# Patient Record
Sex: Male | Born: 1955 | Race: Black or African American | Hispanic: No | Smoking: Never smoker
Health system: Southern US, Community
[De-identification: ages and names within clinical notes are randomized; demographics above are authoritative.]

## PROBLEM LIST (undated history)

## (undated) DIAGNOSIS — F32A Depression, unspecified: Secondary | ICD-10-CM

## (undated) DIAGNOSIS — E785 Hyperlipidemia, unspecified: Secondary | ICD-10-CM

## (undated) DIAGNOSIS — R059 Cough, unspecified: Secondary | ICD-10-CM

## (undated) DIAGNOSIS — I252 Old myocardial infarction: Secondary | ICD-10-CM

## (undated) DIAGNOSIS — R9431 Abnormal electrocardiogram [ECG] [EKG]: Secondary | ICD-10-CM

## (undated) DIAGNOSIS — M549 Dorsalgia, unspecified: Secondary | ICD-10-CM

## (undated) DIAGNOSIS — F1010 Alcohol abuse, uncomplicated: Secondary | ICD-10-CM

## (undated) DIAGNOSIS — I4891 Unspecified atrial fibrillation: Secondary | ICD-10-CM

## (undated) DIAGNOSIS — I10 Essential (primary) hypertension: Secondary | ICD-10-CM

## (undated) DIAGNOSIS — K4090 Unilateral inguinal hernia, without obstruction or gangrene, not specified as recurrent: Secondary | ICD-10-CM

## (undated) DIAGNOSIS — M2140 Flat foot [pes planus] (acquired), unspecified foot: Secondary | ICD-10-CM

## (undated) DIAGNOSIS — Z8619 Personal history of other infectious and parasitic diseases: Secondary | ICD-10-CM

## (undated) DIAGNOSIS — A599 Trichomoniasis, unspecified: Secondary | ICD-10-CM

## (undated) DIAGNOSIS — F1421 Cocaine dependence, in remission: Secondary | ICD-10-CM

## (undated) DIAGNOSIS — Z634 Disappearance and death of family member: Secondary | ICD-10-CM

## (undated) DIAGNOSIS — Z7901 Long term (current) use of anticoagulants: Secondary | ICD-10-CM

## (undated) DIAGNOSIS — G4700 Insomnia, unspecified: Secondary | ICD-10-CM

## (undated) DIAGNOSIS — D649 Anemia, unspecified: Secondary | ICD-10-CM

## (undated) DIAGNOSIS — J449 Chronic obstructive pulmonary disease, unspecified: Secondary | ICD-10-CM

## (undated) DIAGNOSIS — I351 Nonrheumatic aortic (valve) insufficiency: Secondary | ICD-10-CM

## (undated) DIAGNOSIS — F4310 Post-traumatic stress disorder, unspecified: Secondary | ICD-10-CM

## (undated) DIAGNOSIS — I2510 Atherosclerotic heart disease of native coronary artery without angina pectoris: Secondary | ICD-10-CM

## (undated) DIAGNOSIS — D869 Sarcoidosis, unspecified: Secondary | ICD-10-CM

## (undated) DIAGNOSIS — L7633 Postprocedural seroma of skin and subcutaneous tissue following a dermatologic procedure: Secondary | ICD-10-CM

## (undated) DIAGNOSIS — I7100 Dissection of unspecified site of aorta: Secondary | ICD-10-CM

## (undated) DIAGNOSIS — F329 Major depressive disorder, single episode, unspecified: Secondary | ICD-10-CM

## (undated) HISTORY — PX: AORTIC VALVE REPLACEMENT: SHX41

## (undated) HISTORY — PX: HERNIA REPAIR: SHX51

---

## 2001-09-29 DIAGNOSIS — Z952 Presence of prosthetic heart valve: Secondary | ICD-10-CM

## 2001-09-29 HISTORY — DX: Presence of prosthetic heart valve: Z95.2

## 2019-07-25 ENCOUNTER — Ambulatory Visit (INDEPENDENT_AMBULATORY_CARE_PROVIDER_SITE_OTHER): Payer: Federal, State, Local not specified - PPO

## 2019-07-25 ENCOUNTER — Ambulatory Visit
Admission: EM | Admit: 2019-07-25 | Discharge: 2019-07-25 | Disposition: A | Discharge status: Home / Self Care | Payer: Federal, State, Local not specified - PPO | Attending: Internal Medicine | Admitting: Internal Medicine

## 2019-07-25 ENCOUNTER — Other Ambulatory Visit: Payer: Self-pay

## 2019-07-25 DIAGNOSIS — R05 Cough: Secondary | ICD-10-CM

## 2019-07-25 DIAGNOSIS — J441 Chronic obstructive pulmonary disease with (acute) exacerbation: Secondary | ICD-10-CM

## 2019-07-25 MED ORDER — DOXYCYCLINE HYCLATE 100 MG PO CAPS: 100.0000 mg | ORAL_CAPSULE | Freq: Two times a day (BID) | ORAL | 0 refills | Status: AC

## 2019-07-25 MED ORDER — PREDNISONE 20 MG PO TABS: 20.0000 mg | ORAL_TABLET | Freq: Every day | ORAL | 0 refills | Status: AC

## 2019-07-25 NOTE — ED Triage Notes (Signed)
Green productive cough starting Friday, night sweats. Subjective fever. Fatigue. Pt alert and oriented X4, cooperative, RR even and unlabored, color WNL. Pt in NAD.

## 2019-07-25 NOTE — ED Provider Notes (Addendum)
[facility]    CSN: [id number] Arrival date & time: 07/25/19  1444      History   Chief Complaint Chief Complaint  Patient presents with  . Cough    HPI Juan Herrera is a 63 y.o. male comes to the urgent care with complaints of cough productive of greenish sputum, subjective fever fatigue 1 week duration.  Symptoms started insidiously and has continued progressively worse. He denies any URI symptoms preceding this event.No sore throat.  No body aches.  No shortness of breath, wheezing or chest pain.  No dizziness, near syncope or syncopal episode.  Patient denies any sick contacts.   No diarrhea.  HPI  History reviewed. No pertinent past medical history.  There are no active problems to display for this patient.  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  Past medical history:  Aortic valve replacement  Home Medications    Prior to Admission medications   Medication Sig Start Date End Date Taking? Authorizing Provider  warfarin (COUMADIN) 7.5 MG tablet Take 7.5 mg by mouth daily. 7.5mg  X 5 days a week, 5mg  X 2 days a week   Yes [provider]  doxycycline (VIBRAMYCIN) 100 MG capsule Take 1 capsule (100 mg total) by mouth 2 (two) times daily for 5 days. 07/25/19 07/30/19  08/01/19, MD  predniSONE (DELTASONE) 20 MG tablet Take 1 tablet (20 mg total) by mouth daily for 5 days. 07/25/19 07/30/19  08/01/19, MD    Family History No family history on file.  Social History Social History   Tobacco Use  . Smoking status: Never Smoker  Substance Use Topics  . Alcohol use: Yes    Comment: occassionaly  . Drug use: Not on file     Allergies   Patient has no allergy information on record.   Review of Systems Review of Systems  Constitutional: Positive for activity change, chills, fatigue and fever.  HENT: Negative.   Eyes: Negative.   Respiratory: Positive for cough. Negative  for chest tightness, shortness of breath, wheezing and stridor.   Cardiovascular: Negative.   Gastrointestinal: Negative.   Genitourinary: Negative.   Musculoskeletal: Negative.   Skin: Negative.   Neurological: Negative.   Psychiatric/Behavioral: Negative for confusion and decreased concentration.     Physical Exam Triage Vital Signs ED Triage Vitals [07/25/19 1500]  Enc Vitals Group     BP 122/86     Pulse Rate 92     Resp 18     Temp 98.3 F (36.8 C)     Temp Source Oral     SpO2 100 %     Weight 155 lb (70.3 kg)     Height 5\' 7"  (1.702 m)     Head Circumference      Peak Flow      Pain Score 0     Pain Loc      Pain Edu?      Excl. in [place]?    No data found.  Updated Vital Signs BP 122/86 (BP Location: Left Arm)   Pulse 92   Temp 98.3 F (36.8 C) (Oral)   Resp 18   Ht 5\' 7"  (1.702 m)   Wt 70.3 kg   SpO2 100%   BMI 24.28 kg/m   Visual Acuity Right Eye Distance:   Left Eye Distance:   Bilateral Distance:    Right Eye Near:   Left Eye Near:    Bilateral  Near:     Physical Exam Vitals signs and nursing note reviewed.  Constitutional:      General: He is not in acute distress.    Appearance: Normal appearance. He is not ill-appearing or toxic-appearing.  HENT:     Right Ear: Tympanic membrane normal.     Left Ear: Tympanic membrane normal.     Mouth/Throat:     Mouth: Mucous membranes are moist.     Pharynx: No oropharyngeal exudate or posterior oropharyngeal erythema.  Eyes:     Conjunctiva/sclera: Conjunctivae normal.  Neck:     Musculoskeletal: Normal range of motion. No neck rigidity or muscular tenderness.  Cardiovascular:     Rate and Rhythm: Normal rate and regular rhythm.     Pulses: Normal pulses.     Heart sounds: Normal heart sounds.  Pulmonary:     Effort: Pulmonary effort is normal. No respiratory distress.     Breath sounds: No stridor. No wheezing, rhonchi or rales.  Abdominal:     General: Abdomen is flat. Bowel sounds are  normal.  Musculoskeletal: Normal range of motion.        General: No swelling, tenderness or signs of injury.  Skin:    General: Skin is warm.     Capillary Refill: Capillary refill takes less than 2 seconds.     Findings: No erythema or rash.  Neurological:     General: No focal deficit present.     Mental Status: He is alert and oriented to person, place, and time. Mental status is at baseline.     Cranial Nerves: No cranial nerve deficit.     Sensory: No sensory deficit.      UC Treatments / Results  Labs (all labs ordered are listed, but only abnormal results are displayed) Labs Reviewed  NOVEL CORONAVIRUS, NAA (HOSP ORDER, SEND-OUT TO REF LAB; TAT 18-24 HRS)    EKG   Radiology Dg Chest 2 View  Result Date: 07/25/2019 CLINICAL DATA:  Productive cough, fever, night sweats EXAM: CHEST - 2 VIEW COMPARISON:  None. FINDINGS: Heart is normal size. Prior CABG and valve replacement. There is dilatation of the aortic arch and proximal to mid descending thoracic aorta. Tortuosity of the thoracic aorta. There is hyperinflation of the lungs compatible with COPD. Linear scarring in the right mid lung. Right apical scarring/pleural thickening. No acute confluent opacities or effusions. No acute bony abnormality. IMPRESSION: Hyperinflation/COPD. Chronic appearing scarring in the right upper lobe. 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 Recommend further evaluation with chest CT. Electronically Signed   By: Charlett NoseKevin  Dover M.D.   On: 07/25/2019 16:16    Procedures Procedures (including critical care time)  Medications Ordered in UC Medications - No data to display  Initial Impression / Assessment and Plan / UC Course  I have reviewed the triage vital signs and the nursing notes.  Pertinent labs & imaging results that were available during my care of the patient were reviewed by me and considered in my medical decision making (see chart for details).      1.  COPD with acute exacerbation: Chest x-ray is remarkable for COPD. COVID-19 has been sent Patient is advised to self isolate until COVID-19 test results are available patient No wheezing currently has no indication for albuterol inhalation If patient's symptoms gets worse he needs to visit emergency department for further evaluation. Final Clinical Impressions(s) / UC Diagnoses and short course of   Final diagnoses:  COPD with acute  exacerbation [facility])   Discharge Instructions   None    ED Prescriptions    Medication Sig Dispense Auth. Provider   doxycycline (VIBRAMYCIN) 100 MG capsule Take 1 capsule (100 mg total) by mouth 2 (two) times daily for 5 days. 10 capsule Sharell Hilmer, Myrene Galas, MD   predniSONE (DELTASONE) 20 MG tablet Take 1 tablet (20 mg total) by mouth daily for 5 days. 5 tablet Gaelen Brager, Myrene Galas, MD     PDMP not reviewed this encounter.   Chase Picket, MD 07/27/19 1517    Chase Picket, MD 07/27/19 1520

## 2019-07-27 LAB — NOVEL CORONAVIRUS, NAA (HOSP ORDER, SEND-OUT TO REF LAB; TAT 18-24 HRS): SARS-CoV-2, NAA: NOT DETECTED

## 2019-08-06 ENCOUNTER — Encounter: Payer: Self-pay | Admitting: Emergency Medicine

## 2019-08-06 ENCOUNTER — Ambulatory Visit
Admission: EM | Admit: 2019-08-06 | Discharge: 2019-08-06 | Disposition: A | Discharge status: Home / Self Care | Payer: Federal, State, Local not specified - PPO | Attending: Family Medicine | Admitting: Family Medicine

## 2019-08-06 ENCOUNTER — Other Ambulatory Visit: Payer: Self-pay

## 2019-08-06 DIAGNOSIS — Z029 Encounter for administrative examinations, unspecified: Secondary | ICD-10-CM

## 2019-08-06 NOTE — ED Triage Notes (Signed)
Patient was seen 07/25/19 and requesting a note to return to work tomorrow.

## 2019-09-05 NOTE — ED Provider Notes (Signed)
[facility]    CSN: [id number] Arrival date & time: 08/06/19  1140      History   Chief Complaint Chief Complaint  Patient presents with  . Follow-up  . work note    HPI Juan Herrera is a 63 y.o. male.   63 yo male here for note to return to work. Was seen here on 07/25/19 and treated. States he feels well.      History reviewed. No pertinent past medical history.  There are no active problems to display for this patient.   Past Surgical History:  Procedure Laterality Date  . VALVE REPLACEMENT         Home Medications    Prior to Admission medications   Medication Sig Start Date End Date Taking? Authorizing Provider  warfarin (COUMADIN) 7.5 MG tablet Take 7.5 mg by mouth daily. 7.5mg  X 5 days a week, 5mg  X 2 days a week   Yes [provider]    Family History Family History  Problem Relation Age of Onset  . Healthy Mother        passed of old age  . Healthy Father     Social History Social History   Tobacco Use  . Smoking status: Never Smoker  . Smokeless tobacco: Never Used  Substance Use Topics  . Alcohol use: Yes    Comment: occassionaly  . Drug use: Never     Allergies   Patient has no known allergies.   Review of Systems Review of Systems   Physical Exam Triage Vital Signs ED Triage Vitals  Enc Vitals Group     BP 08/06/19 1156 138/85     Pulse Rate 08/06/19 1156 72     Resp 08/06/19 1156 16     Temp 08/06/19 1156 98.2 F (36.8 C)     Temp Source 08/06/19 1156 Oral     SpO2 08/06/19 1156 97 %     Weight 08/06/19 1155 155 lb (70.3 kg)     Height 08/06/19 1155 5\' 7"  (1.702 m)     Head Circumference --      Peak Flow --      Pain Score 08/06/19 1155 0     Pain Loc --      Pain Edu? --      Excl. in [place]? --    No data found.  Updated Vital Signs BP 138/85 (BP Location: Left Arm)   Pulse 72   Temp 98.2 F (36.8 C) (Oral)   Resp 16   Ht 5\' 7"  (1.702 m)   Wt 70.3 kg   SpO2 97%   BMI 24.28  kg/m   Visual Acuity Right Eye Distance:   Left Eye Distance:   Bilateral Distance:    Right Eye Near:   Left Eye Near:    Bilateral Near:     Physical Exam Vitals signs and nursing note reviewed.  Constitutional:      General: He is not in acute distress.    Appearance: He is not toxic-appearing.  Neurological:     Mental Status: He is alert.      UC Treatments / Results  Labs (all labs ordered are listed, but only abnormal results are displayed) Labs Reviewed - No data to display  EKG   Radiology No results found.  Procedures Procedures (including critical care time)  Medications Ordered in UC Medications - No data to display  Initial Impression / Assessment and Plan / UC Course  I have  reviewed the triage vital signs and the nursing notes.  Pertinent labs & imaging results that were available during my care of the patient were reviewed by me and considered in my medical decision making (see chart for details).      Final Clinical Impressions(s) / UC Diagnoses   Final diagnoses:  Administrative encounter    ED Prescriptions    None     1. Work note given  PDMP not reviewed this encounter.   Payton Mccallum, MD 09/05/19 1949

## 2019-12-20 ENCOUNTER — Ambulatory Visit
Admission: EM | Admit: 2019-12-20 | Discharge: 2019-12-20 | Disposition: A | Discharge status: Home / Self Care | Payer: Federal, State, Local not specified - PPO | Attending: Family Medicine | Admitting: Family Medicine

## 2019-12-20 ENCOUNTER — Encounter: Payer: Self-pay | Admitting: Emergency Medicine

## 2019-12-20 ENCOUNTER — Other Ambulatory Visit: Payer: Self-pay

## 2019-12-20 DIAGNOSIS — R319 Hematuria, unspecified: Secondary | ICD-10-CM

## 2019-12-20 DIAGNOSIS — N390 Urinary tract infection, site not specified: Secondary | ICD-10-CM | POA: Diagnosis not present

## 2019-12-20 DIAGNOSIS — Z113 Encounter for screening for infections with a predominantly sexual mode of transmission: Secondary | ICD-10-CM | POA: Diagnosis present

## 2019-12-20 HISTORY — DX: Chronic obstructive pulmonary disease, unspecified: J44.9

## 2019-12-20 HISTORY — DX: Personal history of other infectious and parasitic diseases: Z86.19

## 2019-12-20 LAB — URINALYSIS, COMPLETE (UACMP) WITH MICROSCOPIC
Bilirubin Urine: NEGATIVE
Glucose, UA: NEGATIVE mg/dL
Ketones, ur: NEGATIVE mg/dL
Nitrite: NEGATIVE
Protein, ur: 30 mg/dL — AB
Specific Gravity, Urine: >1.03 — ABNORMAL HIGH (ref 1.005–1.030)
Squamous Epithelial / HPF: NONE SEEN (ref 0–5)
WBC, UA: >50 WBC/hpf (ref 0–5)
pH: 6 (ref 5.0–8.0)

## 2019-12-20 MED ORDER — NITROFURANTOIN MONOHYD MACRO 100 MG PO CAPS: 100.0000 mg | ORAL_CAPSULE | Freq: Two times a day (BID) | ORAL | 0 refills | Status: DC

## 2019-12-20 NOTE — ED Triage Notes (Signed)
Patient c/o soreness in his penis area x 1 week. Denies discharge. He does reports some dysuria. He reports that he is not concerned for STDS but would like to be checked.

## 2019-12-20 NOTE — Discharge Instructions (Signed)
It was very nice seeing you today in clinic. Thank you for entrusting me with your care.   Increase fluid intake as much as possible. Water is the best to flush urinary tract. STD testing  is pending at this time. We will call you if anything is positive and you need further treatment. In the mean time, start the antibiotics for the UTI; finish all of the prescription even is feeling better.   Make arrangements to follow up with your regular doctor in 1 week for re-evaluation if not improving. If your symptoms/condition worsens, please seek follow up care either here or in the ER. Please remember, our [facility] providers are "right here with you" when you need [place].   Again, it was my pleasure to take care of you today. Thank you for choosing our clinic. I hope that you start to feel better quickly.   Quentin Mulling, MSN, APRN, FNP-C, CEN Advanced Practice Provider [facility]

## 2019-12-21 LAB — CHLAMYDIA/NGC RT PCR (ARMC ONLY)
Chlamydia Tr: NOT DETECTED
N gonorrhoeae: NOT DETECTED

## 2019-12-21 LAB — HEPATITIS PANEL, ACUTE
HCV Ab: NONREACTIVE
Hep A IgM: NONREACTIVE
Hep B C IgM: NONREACTIVE
Hepatitis B Surface Ag: NONREACTIVE

## 2019-12-21 LAB — HIV ANTIBODY (ROUTINE TESTING W REFLEX): HIV Screen 4th Generation wRfx: NONREACTIVE

## 2019-12-21 LAB — RPR: RPR Ser Ql: NONREACTIVE

## 2019-12-22 ENCOUNTER — Encounter: Payer: Self-pay | Admitting: Urgent Care

## 2019-12-22 LAB — URINE CULTURE

## 2019-12-22 NOTE — ED Provider Notes (Signed)
[place]   Name: Juan Herrera DOB: 10/10/1955 MRN: [id number] CSN: [id number] PCP: System, Pcp Not In  Arrival date and time:  12/20/19 1612  Chief Complaint:  Dysuria and Penis Pain  NOTE: Prior to seeing the patient today, I have reviewed the triage nursing documentation and vital signs. Clinical staff has updated patient's PMH/PSHx, current medication list, and drug allergies/intolerances to ensure comprehensive history available to assist in medical decision making.   History:   HPI: Juan Herrera is a 64 y.o. male who presents today with complaints of urinary symptoms that began approximately 1 week ago. He complains of dysuria, frequency, and urgency. He has not appreciated any gross hematuria, nor has he noticed his urine being malodorous. Patient denies any associated nausea, vomiting, fever, or chills. He has not experienced any pain in his lower back or flank area, however notes pain and pressure in his suprapubic area. Patient advises that he does not have a past medical history that is significant for recurrent urinary tract infections or urolithiasis. He has a PMH (+) for chlamydia. Patient endoreses that he engages in unprotected sexual activity. He is having pain in his penis from time to time. He denies pain testicles or scrotum; no swelling. No penile bleeding or discharge. While patient verbalizes that he is not concerned about STIs, he is requesting blood and urine testing today.   Past Medical History:  Diagnosis Date  . COPD (chronic obstructive pulmonary disease) (HCC)   . History of chlamydia     Past Surgical History:  Procedure Laterality Date  . AORTIC VALVE REPLACEMENT      Family History  Problem Relation Age of Onset  . Healthy Mother        passed of old age  . Healthy Father     Social History   Tobacco Use  . Smoking status: Never Smoker  . Smokeless tobacco: Never Used  Substance Use Topics  . Alcohol use: Yes    Comment:  occassionaly  . Drug use: Never    There are no problems to display for this patient.   Home Medications:    Current Meds  Medication Sig  . warfarin (COUMADIN) 7.5 MG tablet Take 7.5 mg by mouth daily. 7.5mg  X 5 days a week, 5mg  X 2 days a week    Allergies:   Patient has no known allergies.  Review of Systems (ROS):  Review of systems NEGATIVE unless otherwise noted in narrative H&P section.   Vital Signs: Today's Vitals   12/20/19 1636 12/20/19 1637 12/20/19 1727  BP: (!) 145/93    Pulse: 79    Resp: 18    Temp: 98.4 F (36.9 C)    TempSrc: Oral    SpO2: 98%    Weight:  150 lb (68 kg)   Height:  5\' 7"  (1.702 m)   PainSc: 5   5     Physical Exam: Physical Exam  Constitutional: He is oriented to person, place, and time and well-developed, well-nourished, and in no distress.  HENT:  Head: Normocephalic and atraumatic.  Eyes: Pupils are equal, round, and reactive to light.  Cardiovascular: Normal rate, regular rhythm, normal heart sounds and intact distal pulses.  Pulmonary/Chest: Effort normal and breath sounds normal.  Abdominal: Soft. Normal appearance and bowel sounds are normal. He exhibits no distension. There is abdominal tenderness (pressure) in the suprapubic area. There is no CVA tenderness.  Genitourinary:    Genitourinary Comments: Exam deferred. No penile bleeding or  discharge. No testicular symptoms.    Neurological: He is alert and oriented to person, place, and time. Gait normal.  Skin: Skin is warm and dry. No rash noted. He is not diaphoretic.  Psychiatric: Memory, affect and judgment normal. His mood appears anxious.  Nursing note and vitals reviewed.   Urgent Care Treatments / Results:   LABS: PLEASE NOTE: all labs that were ordered this encounter are listed, however only abnormal results are displayed. Labs Reviewed  URINALYSIS, COMPLETE (UACMP) WITH MICROSCOPIC - Abnormal; Notable for the following components:      Result Value    APPearance HAZY (*)    Specific Gravity, Urine >1.030 (*)    Hgb urine dipstick TRACE (*)    Protein, ur 30 (*)    Leukocytes,Ua MODERATE (*)    Bacteria, UA FEW (*)    All other components within normal limits  CHLAMYDIA/NGC RT PCR ([facility] ONLY)  URINE CULTURE  RPR  HIV ANTIBODY (ROUTINE TESTING W REFLEX)  HEPATITIS PANEL, ACUTE    EKG: -None  RADIOLOGY: No results found.  PROCEDURES: Procedures  MEDICATIONS RECEIVED THIS VISIT: Medications - No data to display  PERTINENT CLINICAL COURSE NOTES/UPDATES:   Initial Impression / Assessment and Plan / Urgent Care Course:  Pertinent labs & imaging results that were available during my care of the patient were personally reviewed by me and considered in my medical decision making (see lab/imaging section of note for values and interpretations).  Juan Herrera is a 64 y.o. male who presents to [facility] today with complaints of Dysuria and Penis Pain  Patient is well appearing overall in clinic today. He does not appear to be in any acute distress. Presenting symptoms (see HPI) and exam as documented above. Symptoms present x 1 week. No fevers, nausea, or vomiting. Patient requesting STI testing. Will proceed as follows:   Marland Kitchen UA was (+) for infection; reflex culture sent.   Will treat with a 5 day course of nitrofurantoin. Patient encouraged to complete the entire course of antibiotics even if he begins to feel better. He was advised that if culture demonstrates resistance to the prescribed antibiotic, he will be contacted and advised of the need to change the antibiotic being used to treat his infection.    Patient encouraged to increase his fluid intake as much as possible. Discussed that water is always best to flush the urinary tract. He was advised to avoid caffeine containing fluids until his infections clears, as caffeine can cause him to experience painful bladder spasms.    May use Tylenol and/or  Ibuprofen as needed for pain/fever.    Patient requesting STI testing in response to his current symptoms. Patient was advised that he would be contacted by phone should any of the tests result (+) and he require further treatment. Encouraged to abstain from sexual activity until after symptoms have resolved and his STI testing has resulted.   Random urine sample collected and sent for [facility] testing.    Blood samples obtained for acute hepatitis panel, HIV, and RPR.   Discussed follow up with primary care physician in 1 week for re-evaluation. I have reviewed the follow up and strict return precautions for any new or worsening symptoms. Patient is aware of symptoms that would be deemed urgent/emergent, and would thus require further evaluation either here or in the emergency department. At the time of discharge, he verbalized understanding and consent with the discharge plan as it was reviewed with him. All questions were  fielded by provider and/or clinic staff prior to patient discharge.    Final Clinical Impressions / Urgent Care Diagnoses:   Final diagnoses:  Urinary tract infection with hematuria, site unspecified  Screening examination for STD (sexually transmitted disease)    New Prescriptions:  [place] Controlled Substance Registry consulted? Not Applicable  Meds ordered this encounter  Medications  . nitrofurantoin, macrocrystal-monohydrate, (MACROBID) 100 MG capsule    Sig: Take 1 capsule (100 mg total) by mouth 2 (two) times daily.    Dispense:  10 capsule    Refill:  0    Recommended Follow up Care:  Patient encouraged to follow up with the following provider within the specified time frame, or sooner as dictated by the severity of his symptoms. As always, he was instructed that for any urgent/emergent care needs, he should seek care either here or in the emergency department for more immediate evaluation.  Follow-up Information    PCP In 1 week.   Why: General reassessment of  symptoms if not improving        NOTE: This note was prepared using Lobbyist along with smaller Company secretary. Despite my best ability to proofread, there is the potential that transcriptional errors may still occur from this process, and are completely unintentional.    Karen Kitchens, NP 12/22/19 [phone]

## 2019-12-28 ENCOUNTER — Other Ambulatory Visit: Payer: Self-pay

## 2019-12-28 ENCOUNTER — Ambulatory Visit
Admission: EM | Admit: 2019-12-28 | Discharge: 2019-12-28 | Disposition: A | Discharge status: Home / Self Care | Payer: Federal, State, Local not specified - PPO | Attending: Emergency Medicine | Admitting: Emergency Medicine

## 2019-12-28 ENCOUNTER — Ambulatory Visit (INDEPENDENT_AMBULATORY_CARE_PROVIDER_SITE_OTHER)
Admit: 2019-12-28 | Discharge: 2019-12-28 | Disposition: A | Discharge status: Home / Self Care | Payer: Federal, State, Local not specified - PPO | Attending: Emergency Medicine | Admitting: Emergency Medicine

## 2019-12-28 DIAGNOSIS — N50811 Right testicular pain: Secondary | ICD-10-CM | POA: Diagnosis not present

## 2019-12-28 DIAGNOSIS — N451 Epididymitis: Secondary | ICD-10-CM

## 2019-12-28 LAB — URINALYSIS, COMPLETE (UACMP) WITH MICROSCOPIC
Bilirubin Urine: NEGATIVE
Glucose, UA: NEGATIVE mg/dL
Hgb urine dipstick: NEGATIVE
Ketones, ur: NEGATIVE mg/dL
Nitrite: NEGATIVE
Protein, ur: 30 mg/dL — AB
Specific Gravity, Urine: >1.03 — ABNORMAL HIGH (ref 1.005–1.030)
Squamous Epithelial / HPF: NONE SEEN (ref 0–5)
pH: 5.5 (ref 5.0–8.0)

## 2019-12-28 MED ORDER — LEVOFLOXACIN 500 MG PO TABS: 500.0000 mg | ORAL_TABLET | Freq: Every day | ORAL | 0 refills | Status: DC

## 2019-12-28 NOTE — Discharge Instructions (Addendum)
The final reading on your ultrasound is pending, I will call you if we need to change your management in any way.  Follow-up with your primary care physician in several days as the antibiotic but I am sending you home on can increase your INR.  You need to have your warfarin dose adjusted accordingly while you are taking the Levaquin.  Follow-up with a urologist at the [facility] if you are not not getting better after finishing the Levaquin, go to the ER for the signs and symptoms we discussed.  You may apply ice and scrotal elevation as needed for pain relief.  I have sent off a urine for culture.  We will call you only if we need to change things.

## 2019-12-28 NOTE — ED Provider Notes (Signed)
HPI  SUBJECTIVE:  Juan Herrera is a 64 y.o. male who presents with 1 1/2 weeks of intermittent right testicular pain described as soreness. He states it lasts hours. He reports swelling behind his testicle and is not sure if the testicle itself is swollen. He states it is very sensitive to touch. No nausea vomiting fevers scrotal swelling. No abdominal back pelvic perineal pain. No dysuria urgency frequency cloudy odorous urine hematuria. No testicular trauma or change in physical activity. He had symptoms like this before 25 years ago but was not sure what the diagnosis was. States that he was treated with medicine and scrotal elevation. Patient was seen here on 3/21 for urinary symptoms.  He had no testicular pain per note, however patient states that he was having the testicular pain when he was evaluated. Gonorrhea and Chlamydia were negative, his urine was suggestive of a UTI and was started on Macrobid.  His urine culture grew out mixed species. Patient states that he finished the Macrobid and his testicular pain improved. States that his symptoms returned several days after. He has also tried testicular elevation with improvement in his symptoms. No aggravating factors. He is in a long-term monogamous relationship with a male who is asymptomatic. He has a past medical history of chlamydia, aortic valve replacement on warfarin, COPD. No history of diabetes hypertension UTI pyelonephritis prostatitis epididymitis orchitis torsion gonorrhea HIV HSV trichomonas. [facility]rology: None. States that he will be able to see one at the [place] if necessary.  Past Medical History:  Diagnosis Date  . COPD (chronic obstructive pulmonary disease) ([facility])   . History of chlamydia     Past Surgical History:  Procedure Laterality Date  . AORTIC VALVE REPLACEMENT      Family History  Problem Relation Age of Onset  . Healthy Mother        passed of old age  . Healthy Father     Social History    Tobacco Use  . Smoking status: Never Smoker  . Smokeless tobacco: Never Used  Substance Use Topics  . Alcohol use: Yes    Comment: occassionaly  . Drug use: Never    No current facility-administered medications for this encounter.  Current Outpatient Medications:  .  warfarin (COUMADIN) 7.5 MG tablet, Take 7.5 mg by mouth daily. 7.5mg  X 5 days a week, 5mg  X 2 days a week, Disp: , Rfl:  .  levofloxacin (LEVAQUIN) 500 MG tablet, Take 1 tablet (500 mg total) by mouth daily., Disp: 10 tablet, Rfl: 0  No Known Allergies   ROS  As noted in HPI.   Physical Exam  BP 119/84 (BP Location: Left Arm)   Pulse 80   Temp 98.1 F (36.7 C) (Oral)   Resp 18   Ht 5\' 7"  (1.702 m)   Wt 68 kg   SpO2 99%   BMI 23.49 kg/m   Constitutional: Well developed, well nourished, no acute distress Eyes:  EOMI, conjunctiva normal bilaterally HENT: Normocephalic, atraumatic,mucus membranes moist Respiratory: Normal inspiratory effort Cardiovascular: Normal rate GI: nondistended soft.  No suprapubic, right or left lower quadrant tenderness. GU: Normal circumcised male.  No penile rash, discharge.  Positive tenderness , swelling of the right epididymis and questionable swelling of the right testicle.  Positive phren's sign.  Scrotum nonerythematous.  No cremasteric reflex on either side.  Large left nontender inguinal hernia.  Patient declined chaperone Rectal: Normal nontender firm not boggy, normal-sized prostate Lymph: No inguinal lymphadenopathy skin: No rash,  skin intact Musculoskeletal: no deformities Neurologic: Alert & oriented x 3, no focal neuro deficits Psychiatric: Speech and behavior appropriate   ED Course   Medications - No data to display  Orders Placed This Encounter  Procedures  . Urine culture    Standing Status:   Standing    Number of Occurrences:   1    Order Specific Question:   List patient's active antibiotics    Answer:   levaquin  . US SCROTUM    1.5 week  intermittent  R testicle pain, r/o torsion, epidydimitis, orchitis    Standing Status:   Standing    Number of Occurrences:   1    Order Specific Question:   Symptom/Reason for Exam    Answer:   Pain in right testicle [[id number]]    Order Specific Question:   Release to patient    Answer:   Immediate  . Urinalysis, Complete w Microscopic    Standing Status:   Standing    Number of Occurrences:   1    Results for orders placed or performed during the hospital encounter of 12/28/19 (from the past 24 hour(s))  Urinalysis, Complete w Microscopic     Status: Abnormal   Collection Time: 12/28/19  3:59 PM  Result Value Ref Range   Color, Urine YELLOW YELLOW   APPearance CLEAR CLEAR   Specific Gravity, Urine >1.030 (H) 1.005 - 1.030   pH 5.5 5.0 - 8.0   Glucose, UA NEGATIVE NEGATIVE mg/dL   Hgb urine dipstick NEGATIVE NEGATIVE   Bilirubin Urine NEGATIVE NEGATIVE   Ketones, ur NEGATIVE NEGATIVE mg/dL   Protein, ur 30 (A) NEGATIVE mg/dL   Nitrite NEGATIVE NEGATIVE   Leukocytes,Ua SMALL (A) NEGATIVE   Squamous Epithelial / LPF NONE SEEN 0 - 5   WBC, UA 6-10 0 - 5 WBC/hpf   RBC / HPF 0-5 0 - 5 RBC/hpf   Bacteria, UA RARE (A) NONE SEEN   US SCROTUM  Result Date: 12/28/2019 CLINICAL DATA:  Right testicular pain for the past 2 weeks. EXAM: SCROTAL ULTRASOUND DOPPLER ULTRASOUND OF THE TESTICLES TECHNIQUE: Complete ultrasound examination of the testicles, epididymis, and other scrotal structures was performed. Color and spectral Doppler ultrasound were also utilized to evaluate blood flow to the testicles. COMPARISON:  None. FINDINGS: Right testicle Measurements: 3.5 x 2.4 x 3.0 cm. No mass or microlithiasis visualized. Left testicle Measurements: 4.1 x 2.0 x 2.4 cm. No mass or microlithiasis visualized. Right epididymis:  Enlarged and hyperemic. Left epididymis: Normal in size and appearance. 5 mm epididymal cyst. Hydrocele:  Small right hydrocele. Varicocele:  None visualized. Pulsed Doppler  interrogation of both testes demonstrates normal low resistance arterial and venous waveforms bilaterally. IMPRESSION: 1. Right epididymitis with small reactive right hydrocele. Electronically Signed   By: Obie Dredge M.D.   On: 12/28/2019 17:19    ED Clinical Impression  1. Epididymitis, right   2. Pain in right testicle      ED Assessment/Plan  Previous records reviewed as noted in HPI.  Suspect epididymitis versus epididymo-orchitis.  However ultrasound is available today at the urgent care, so we will get ultrasound to rule out torsion.  No evidence of Fournier's gangrene.  Checking urine.  Since his gonorrhea and Chlamydia were negative, will not repeat this.  Recent UTI is a risk factor for epididymitis, and while his recent urine culture was inconclusive for UTI he did respond to the Macrobid.   will check another UA.  UA with small leukocytes  and rare bacteria. Trace protein. It is also concentrated. Will send this off for culture to confirm absence of UTI and if present, susceptibility to Levaquin.  Reviewed imaging independently and with the ultrasound tech. Positive epididymitis on the right side with a reactive hydrocele.  Normal blood flow.  See radiology report for details  Patient with epididymitis. Plan to send home with Levaquin 500 mg daily for 10 days.  There is no associated prostatitis so 10 days should be sufficient.  Also, local application of ice, scrotal elevation.  Follow-up with his PMD in several days for INR check.  Bactrim also elevates INR.  Levaquin was the most reasonable choice.  Follow-up with urology at the [facility] if symptoms do not improve after finishing the Levaquin.  To the ER if he gets worse.  Discussed labs, imaging, MDM, treatment plan, and plan for follow-up with patient. Discussed sn/sx that should prompt return to the ED. patient agrees with plan.   Meds ordered this encounter  Medications  . levofloxacin (LEVAQUIN) 500 MG tablet    Sig: Take 1  tablet (500 mg total) by mouth daily.    Dispense:  10 tablet    Refill:  0    *This clinic note was created using Scientist, clinical (histocompatibility and immunogenetics). Therefore, there may be occasional mistakes despite careful proofreading.   ?    Domenick Gong, MD 12/28/19 1755

## 2019-12-28 NOTE — ED Triage Notes (Signed)
Pt presents with c/o continued scrotal pain. Pt was seen 12/20/19 and treated for possible UTI. Pt has completed Macrobid and still has symptoms. Pt denies any urinary symptoms.

## 2019-12-29 LAB — URINE CULTURE: Culture: NO GROWTH

## 2020-04-10 ENCOUNTER — Ambulatory Visit
Admission: EM | Admit: 2020-04-10 | Discharge: 2020-04-10 | Disposition: A | Discharge status: Home / Self Care | Payer: Federal, State, Local not specified - PPO | Attending: Family Medicine | Admitting: Family Medicine

## 2020-04-10 DIAGNOSIS — J441 Chronic obstructive pulmonary disease with (acute) exacerbation: Secondary | ICD-10-CM | POA: Diagnosis present

## 2020-04-10 DIAGNOSIS — Z20822 Contact with and (suspected) exposure to covid-19: Secondary | ICD-10-CM | POA: Diagnosis not present

## 2020-04-10 MED ORDER — AZITHROMYCIN 250 MG PO TABS: ORAL_TABLET | ORAL | 0 refills | Status: DC

## 2020-04-10 MED ORDER — PREDNISONE 50 MG PO TABS: ORAL_TABLET | ORAL | 0 refills | Status: DC

## 2020-04-10 MED ORDER — BENZONATATE 200 MG PO CAPS: 200.0000 mg | ORAL_CAPSULE | Freq: Three times a day (TID) | ORAL | 0 refills | Status: DC | PRN

## 2020-04-10 NOTE — ED Triage Notes (Signed)
Pt presents to UC for productive cough x3 days. Pt states phlegm has transitioned from clear to green. Pt denies, sob, chills, body aches, head aches, runny nose, sore throat, loss of taste or smell. Pt endorsing subjective fevers. Pt requesting covid testing at this time.

## 2020-04-10 NOTE — ED Provider Notes (Signed)
[facility]    CSN: [id number] Arrival date & time: 04/10/20  1322  History   Chief Complaint Chief Complaint  Patient presents with  . Cough   HPI  64 year old male presents with productive cough.  Patient has known COPD.  Patient reports worsening cough for the past 3 days.  Productive.  Yellow to green sputum.  He reports subjective fever.  No other respiratory symptoms.  No reported sick contacts.  Endorses compliance with his home medications.  No other associated symptoms.  No other complaints.  Past Medical History:  Diagnosis Date  . COPD (chronic obstructive pulmonary disease) ([facility])   . History of chlamydia    Past Surgical History:  Procedure Laterality Date  . AORTIC VALVE REPLACEMENT     Home Medications    Prior to Admission medications   Medication Sig Start Date End Date Taking? Authorizing Provider  buPROPion (WELLBUTRIN) 100 MG tablet Take by mouth.   Yes [provider]  warfarin (COUMADIN) 7.5 MG tablet Take 7.5 mg by mouth daily. 7.5mg  X 5 days a week, 5mg  X 2 days a week   Yes [provider]  azithromycin (ZITHROMAX) 250 MG tablet 2 tablets on day 1, then 1 tablet daily on days 2-5. 04/10/20   04/12/20, DO  benzonatate (TESSALON) 200 MG capsule Take 1 capsule (200 mg total) by mouth 3 (three) times daily as needed for cough. 04/10/20   04/12/20, DO  predniSONE (DELTASONE) 50 MG tablet 1 tablet daily x 5 days 04/10/20   04/12/20, DO    Family History Family History  Problem Relation Age of Onset  . Healthy Mother        passed of old age  . Healthy Father     Social History Social History   Tobacco Use  . Smoking status: Never Smoker  . Smokeless tobacco: Never Used  Vaping Use  . Vaping Use: Never used  Substance Use Topics  . Alcohol use: Yes    Comment: occassionaly  . Drug use: Never     Allergies   Patient has no known allergies.   Review of Systems Review of Systems  Constitutional:  Positive for fever.  Respiratory: Positive for cough.    Physical Exam Triage Vital Signs ED Triage Vitals  Enc Vitals Group     BP 04/10/20 1338 125/84     Pulse Rate 04/10/20 1338 74     Resp 04/10/20 1338 16     Temp 04/10/20 1338 98.3 F (36.8 C)     Temp Source 04/10/20 1338 Oral     SpO2 04/10/20 1338 98 %     Weight --      Height --      Head Circumference --      Peak Flow --      Pain Score 04/10/20 1340 0     Pain Loc --      Pain Edu? --      Excl. in GC? --     Updated Vital Signs BP 125/84 (BP Location: Right Arm)   Pulse 74   Temp 98.3 F (36.8 C) (Oral)   Resp 16   SpO2 98%   Visual Acuity Right Eye Distance:   Left Eye Distance:   Bilateral Distance:    Right Eye Near:   Left Eye Near:    Bilateral Near:     Physical Exam Vitals and nursing note reviewed.  Constitutional:  General: He is not in acute distress.    Appearance: Normal appearance. He is not ill-appearing.  HENT:     Head: Normocephalic and atraumatic.  Eyes:     General:        Right eye: No discharge.        Left eye: No discharge.     Conjunctiva/sclera: Conjunctivae normal.  Cardiovascular:     Rate and Rhythm: Normal rate and regular rhythm.     Heart sounds: No murmur heard.   Pulmonary:     Effort: Pulmonary effort is normal.     Breath sounds: No wheezing or rales.  Neurological:     Mental Status: He is alert.  Psychiatric:        Mood and Affect: Mood normal.        Behavior: Behavior normal.    UC Treatments / Results  Labs (all labs ordered are listed, but only abnormal results are displayed) Labs Reviewed  SARS CORONAVIRUS 2 (TAT 6-24 HRS)    EKG   Radiology No results found.  Procedures Procedures (including critical care time)  Medications Ordered in UC Medications - No data to display  Initial Impression / Assessment and Plan / UC Course  I have reviewed the triage vital signs and the nursing notes.  Pertinent labs & imaging  results that were available during my care of the patient were reviewed by me and considered in my medical decision making (see chart for details).    64 year old male presents with a COPD exacerbation.  Treating with prednisone, azithromycin, Tessalon Perles.  Advised that he needs close follow-up regarding his INR given antibiotic use.  Final Clinical Impressions(s) / UC Diagnoses   Final diagnoses:  COPD exacerbation [facility])   Discharge Instructions   None    ED Prescriptions    Medication Sig Dispense Auth. Provider   predniSONE (DELTASONE) 50 MG tablet 1 tablet daily x 5 days 5 tablet Colby Catanese G, DO   azithromycin (ZITHROMAX) 250 MG tablet 2 tablets on day 1, then 1 tablet daily on days 2-5. 6 tablet Magaly Pollina G, DO   benzonatate (TESSALON) 200 MG capsule Take 1 capsule (200 mg total) by mouth 3 (three) times daily as needed for cough. 30 capsule Tommie Sams, DO     PDMP not reviewed this encounter.   Tommie Sams, DO 04/10/20 1455

## 2020-04-11 LAB — SARS CORONAVIRUS 2 (TAT 6-24 HRS): SARS Coronavirus 2: NEGATIVE

## 2020-06-19 ENCOUNTER — Ambulatory Visit
Admission: EM | Admit: 2020-06-19 | Discharge: 2020-06-19 | Disposition: A | Discharge status: Home / Self Care | Payer: Federal, State, Local not specified - PPO | Attending: Physician Assistant | Admitting: Physician Assistant

## 2020-06-19 ENCOUNTER — Other Ambulatory Visit: Payer: Self-pay

## 2020-06-19 ENCOUNTER — Encounter: Payer: Self-pay | Admitting: Emergency Medicine

## 2020-06-19 DIAGNOSIS — Z20822 Contact with and (suspected) exposure to covid-19: Secondary | ICD-10-CM | POA: Diagnosis not present

## 2020-06-19 DIAGNOSIS — R05 Cough: Secondary | ICD-10-CM | POA: Insufficient documentation

## 2020-06-19 DIAGNOSIS — Z79899 Other long term (current) drug therapy: Secondary | ICD-10-CM | POA: Insufficient documentation

## 2020-06-19 DIAGNOSIS — J441 Chronic obstructive pulmonary disease with (acute) exacerbation: Secondary | ICD-10-CM | POA: Insufficient documentation

## 2020-06-19 DIAGNOSIS — Z952 Presence of prosthetic heart valve: Secondary | ICD-10-CM | POA: Insufficient documentation

## 2020-06-19 DIAGNOSIS — Z7901 Long term (current) use of anticoagulants: Secondary | ICD-10-CM | POA: Insufficient documentation

## 2020-06-19 DIAGNOSIS — R059 Cough, unspecified: Secondary | ICD-10-CM

## 2020-06-19 MED ORDER — AZITHROMYCIN 250 MG PO TABS: 250.0000 mg | ORAL_TABLET | Freq: Every day | ORAL | 0 refills | Status: DC

## 2020-06-19 MED ORDER — PREDNISONE 20 MG PO TABS: 20.0000 mg | ORAL_TABLET | Freq: Every day | ORAL | 0 refills | Status: AC

## 2020-06-19 NOTE — ED Triage Notes (Signed)
Patient c/o cough that started on Friday. Denies any other symptoms.

## 2020-06-19 NOTE — Discharge Instructions (Signed)

## 2020-06-19 NOTE — ED Provider Notes (Signed)
[facility]    CSN: [id number] Arrival date & time: 06/19/20  1601      History   Chief Complaint Chief Complaint  Patient presents with  . Cough    HPI Juan Herrera is a 64 y.o. male.   Patient presents with complaint of cough x4 days.  He says the cough is productive of yellowish-green mucus.  He denies any associated fevers or breathing difficulty.  Denies chest pain.  He denies any known Covid exposure.  He says he had Covid about 6 months ago.  Patient has a history of COPD and asthma as well.  He says he uses 2 different inhalers.  He denies any sore throat, nasal congestion, sinus pain or pressure, abdominal pain, nausea, vomiting, diarrhea, changes in smell or taste.  He has been taking Mucinex without much relief of the cough.  He has no other concerns today.  HPI  Past Medical History:  Diagnosis Date  . COPD (chronic obstructive pulmonary disease) (HCC)   . History of chlamydia     There are no problems to display for this patient.   Past Surgical History:  Procedure Laterality Date  . AORTIC VALVE REPLACEMENT         Home Medications    Prior to Admission medications   Medication Sig Start Date End Date Taking? Authorizing Provider  buPROPion (WELLBUTRIN) 100 MG tablet Take by mouth.   Yes [provider]  warfarin (COUMADIN) 7.5 MG tablet Take 7.5 mg by mouth daily. 7.5mg  X 5 days a week, 5mg  X 2 days a week   Yes [provider]  azithromycin (ZITHROMAX) 250 MG tablet Take 1 tablet (250 mg total) by mouth daily. Take first 2 tablets together, then 1 every day until finished. 06/19/20   Shirlee LatchEaves, Tucker Minter B, PA-C  predniSONE (DELTASONE) 20 MG tablet Take 1 tablet (20 mg total) by mouth daily for 5 days. 06/19/20 06/24/20  Shirlee LatchEaves, Dawsen Krieger B, PA-C    Family History Family History  Problem Relation Age of Onset  . Healthy Mother        passed of old age  . Healthy Father     Social History Social History   Tobacco Use  .  Smoking status: Never Smoker  . Smokeless tobacco: Never Used  Vaping Use  . Vaping Use: Never used  Substance Use Topics  . Alcohol use: Yes    Comment: occassionaly  . Drug use: Never     Allergies   Patient has no known allergies.   Review of Systems Review of Systems  Constitutional: Negative for fatigue and fever.  HENT: Negative for congestion, rhinorrhea, sinus pressure, sinus pain and sore throat.   Respiratory: Positive for cough. Negative for shortness of breath and wheezing.   Gastrointestinal: Negative for abdominal pain, diarrhea, nausea and vomiting.  Musculoskeletal: Negative for myalgias.  Neurological: Negative for weakness, light-headedness and headaches.  Hematological: Negative for adenopathy.     Physical Exam Triage Vital Signs ED Triage Vitals  Enc Vitals Group     BP 06/19/20 1614 129/89     Pulse Rate 06/19/20 1614 85     Resp 06/19/20 1614 18     Temp 06/19/20 1614 98.4 F (36.9 C)     Temp Source 06/19/20 1614 Oral     SpO2 06/19/20 1614 98 %     Weight 06/19/20 1611 150 lb (68 kg)     Height 06/19/20 1611 5\' 7"  (1.702 m)  Head Circumference --      Peak Flow --      Pain Score 06/19/20 1611 0     Pain Loc --      Pain Edu? --      Excl. in GC? --    No data found.  Updated Vital Signs BP 129/89 (BP Location: Right Arm)   Pulse 85   Temp 98.4 F (36.9 C) (Oral)   Resp 18   Ht 5\' 7"  (1.702 m)   Wt 150 lb (68 kg)   SpO2 98%   BMI 23.49 kg/m       Physical Exam Vitals and nursing note reviewed.  Constitutional:      General: He is not in acute distress.    Appearance: Normal appearance. He is well-developed. He is not toxic-appearing or diaphoretic.  HENT:     Head: Normocephalic and atraumatic.     Nose: Nose normal.     Mouth/Throat:     Mouth: Mucous membranes are moist.     Pharynx: Oropharynx is clear. Uvula midline.     Tonsils: No tonsillar abscesses.  Eyes:     General: No scleral icterus.       Right eye:  No discharge.        Left eye: No discharge.     Conjunctiva/sclera: Conjunctivae normal.     Pupils: Pupils are equal, round, and reactive to light.  Neck:     Thyroid: No thyromegaly.     Trachea: No tracheal deviation.  Cardiovascular:     Rate and Rhythm: Normal rate and regular rhythm.     Heart sounds: Normal heart sounds.  Pulmonary:     Effort: Pulmonary effort is normal. No respiratory distress.     Breath sounds: Normal breath sounds. No wheezing or rales.  Musculoskeletal:     Cervical back: Normal range of motion and neck supple.  Lymphadenopathy:     Cervical: No cervical adenopathy.  Skin:    General: Skin is warm and dry.     Findings: No rash.  Neurological:     General: No focal deficit present.     Mental Status: He is alert. Mental status is at baseline.     Motor: No weakness.     Gait: Gait normal.  Psychiatric:        Mood and Affect: Mood normal.        Behavior: Behavior normal.        Thought Content: Thought content normal.      UC Treatments / Results  Labs (all labs ordered are listed, but only abnormal results are displayed) Labs Reviewed  SARS CORONAVIRUS 2 (TAT 6-24 HRS)    EKG   Radiology No results found.  Procedures Procedures (including critical care time)  Medications Ordered in UC Medications - No data to display  Initial Impression / Assessment and Plan / UC Course  I have reviewed the triage vital signs and the nursing notes.  Pertinent labs & imaging results that were available during my care of the patient were reviewed by me and considered in my medical decision making (see chart for details).   64 year old male with multiple comorbidities presenting for worsening cough.  He does have COPD and says he gets really sick sometimes.  Covid testing obtained.  CDC guidelines, isolation protocol and ED precautions discussed if positive.  He would be interested in the MAB therapy if positive.  Advised patient that we will call  him and set that  up if positive.  Since he does have COPD and has a new worsening cough, treating for COPD exacerbation with prednisone and azithromycin.  He says he has had this before and it did not affect his INRs.  He keeps regular check on those.  Advised patient to increase fluids and take Mucinex.  He should follow-up for any fever, shortness of breath or worsening symptoms.  Final Clinical Impressions(s) / UC Diagnoses   Final diagnoses:  COPD exacerbation (HCC)  Cough     Discharge Instructions     You have received COVID testing today either for positive exposure, concerning symptoms that could be related to COVID infection, screening purposes, or re-testing after confirmed positive.  Your test obtained today checks for active viral infection in the last 1-2 weeks. If your test is negative now, you can still test positive later. So, if you do develop symptoms you should either get re-tested and/or isolate x 10 days. Please follow CDC guidelines.  While Rapid antigen tests come back in 15-20 minutes, send out PCR/molecular test results typically come back within 24 hours. In the mean time, if you are symptomatic, assume this could be a positive test and treat/monitor yourself as if you do have COVID.   We will call with test results. Please download the MyChart app and set up a profile to access test results.   If symptomatic, go home and rest. Push fluids. Take Tylenol as needed for discomfort. Gargle warm salt water. Throat lozenges. Take Mucinex DM or Robitussin for cough. Humidifier in bedroom to ease coughing. Warm showers. Also review the COVID handout for more information.  COVID-19 INFECTION: The incubation period of COVID-19 is approximately 14 days after exposure, with most symptoms developing in roughly 4-5 days. Symptoms may range in severity from mild to critically severe. Roughly 80% of those infected will have mild symptoms. People of any age may become infected with  COVID-19 and have the ability to transmit the virus. The most common symptoms include: fever, fatigue, cough, body aches, headaches, sore throat, nasal congestion, shortness of breath, nausea, vomiting, diarrhea, changes in smell and/or taste.    COURSE OF ILLNESS Some patients may begin with mild disease which can progress quickly into critical symptoms. If your symptoms are worsening please call ahead to the Emergency Department and proceed there for further treatment. Recovery time appears to be roughly 1-2 weeks for mild symptoms and 3-6 weeks for severe disease.   GO IMMEDIATELY TO ER FOR FEVER YOU ARE UNABLE TO GET DOWN WITH TYLENOL, BREATHING PROBLEMS, CHEST PAIN, FATIGUE, LETHARGY, INABILITY TO EAT OR DRINK, ETC  QUARANTINE AND ISOLATION: To help decrease the spread of COVID-19 please remain isolated if you have COVID infection or are highly suspected to have COVID infection. This means -stay home and isolate to one room in the home if you live with others. Do not share a bed or bathroom with others while ill, sanitize and wipe down all countertops and keep common areas clean and disinfected. You may discontinue isolation if you have a mild case and are asymptomatic 10 days after symptom onset as long as you have been fever free >24 hours without having to take Motrin or Tylenol. If your case is more severe (meaning you develop pneumonia or are admitted in the hospital), you may have to isolate longer.   If you have been in close contact (within 6 feet) of someone diagnosed with COVID 19, you are advised to quarantine in your home for 14  days as symptoms can develop anywhere from 2-14 days after exposure to the virus. If you develop symptoms, you  must isolate.  Most current guidelines for COVID after exposure -isolate 10 days if you ARE NOT tested for COVID as long as symptoms do not develop -isolate 7 days if you are tested and remain asymptomatic -You do not necessarily need to be tested for  COVID if you have + exposure and        develop   symptoms. Just isolate at home x10 days from symptom onset During this global pandemic, CDC advises to practice social distancing, try to stay at least 41ft away from others at all times. Wear a face covering. Wash and sanitize your hands regularly and avoid going anywhere that is not necessary.  KEEP IN MIND THAT THE COVID TEST IS NOT 100% ACCURATE AND YOU SHOULD STILL DO EVERYTHING TO PREVENT POTENTIAL SPREAD OF VIRUS TO OTHERS (WEAR MASK, WEAR GLOVES, WASH HANDS AND SANITIZE REGULARLY). IF INITIAL TEST IS NEGATIVE, THIS MAY NOT MEAN YOU ARE DEFINITELY NEGATIVE. MOST ACCURATE TESTING IS DONE 5-7 DAYS AFTER EXPOSURE.   It is not advised by CDC to get re-tested after receiving a positive COVID test since you can still test positive for weeks to months after you have already cleared the virus.   *If you have not been vaccinated for COVID, I strongly suggest you consider getting vaccinated as long as there are no contraindications.      ED Prescriptions    Medication Sig Dispense Auth. Provider   predniSONE (DELTASONE) 20 MG tablet Take 1 tablet (20 mg total) by mouth daily for 5 days. 5 tablet Eusebio Friendly B, PA-C   azithromycin (ZITHROMAX) 250 MG tablet Take 1 tablet (250 mg total) by mouth daily. Take first 2 tablets together, then 1 every day until finished. 6 tablet Gareth Morgan     PDMP not reviewed this encounter.   Shirlee Latch, PA-C 06/19/20 1708

## 2020-06-20 LAB — SARS CORONAVIRUS 2 (TAT 6-24 HRS): SARS Coronavirus 2: NEGATIVE

## 2020-07-24 ENCOUNTER — Ambulatory Visit
Admission: EM | Admit: 2020-07-24 | Discharge: 2020-07-24 | Disposition: A | Discharge status: Home / Self Care | Payer: Federal, State, Local not specified - PPO

## 2020-07-24 ENCOUNTER — Encounter: Payer: Self-pay | Admitting: Emergency Medicine

## 2020-07-24 ENCOUNTER — Other Ambulatory Visit: Payer: Self-pay

## 2020-07-24 DIAGNOSIS — K4090 Unilateral inguinal hernia, without obstruction or gangrene, not specified as recurrent: Secondary | ICD-10-CM

## 2020-07-24 DIAGNOSIS — K5900 Constipation, unspecified: Secondary | ICD-10-CM

## 2020-07-24 NOTE — ED Triage Notes (Signed)
Patient c/o abdominal pain x 3 days. He states he thinks he is having problems with his hernia.

## 2020-07-24 NOTE — Discharge Instructions (Signed)
Please come back if your pain is not better or you get worse, since you will need more test done.

## 2020-07-24 NOTE — ED Provider Notes (Signed)
[facility]    CSN: [id number] Arrival date & time: 07/24/20  1851      History   Chief Complaint Chief Complaint  Patient presents with  . Abdominal Pain    HPI Juan Herrera is a 64 y.o. male who presents with increased L inguinal swelling and pain due to constipation which is recurrent and started on Sunday. He took a laxative and helped pass the stool that was stuck on the bowel in his inguinal hernia, but feels there is more stool and normally drinks prune juice to empty out, but cant go to work when he does this. And needs a note til 10/28. He Denis fever, chills, blood in stool, decreased appetite. Had his last colonoscopy 2 years ago and was not told he had diverticulum's  He cant have this hernia repair until he does pulmonary rehab since his PFT's are poor.    Past Medical History:  Diagnosis Date  . COPD (chronic obstructive pulmonary disease) (HCC)   . History of chlamydia     There are no problems to display for this patient.   Past Surgical History:  Procedure Laterality Date  . AORTIC VALVE REPLACEMENT         Home Medications    Prior to Admission medications   Medication Sig Start Date End Date Taking? Authorizing Provider  buPROPion (WELLBUTRIN) 100 MG tablet Take by mouth.   Yes [provider]  warfarin (COUMADIN) 7.5 MG tablet Take 7.5 mg by mouth daily. 7.5mg  X 5 days a week, 5mg  X 2 days a week   Yes [provider]  azithromycin (ZITHROMAX) 250 MG tablet Take 1 tablet (250 mg total) by mouth daily. Take first 2 tablets together, then 1 every day until finished. 06/19/20   06/21/20, PA-C    Family History Family History  Problem Relation Age of Onset  . Healthy Mother        passed of old age  . Healthy Father     Social History Social History   Tobacco Use  . Smoking status: Never Smoker  . Smokeless tobacco: Never Used  Vaping Use  . Vaping Use: Never used  Substance Use Topics  .  Alcohol use: Yes    Comment: occassionaly  . Drug use: Never     Allergies   Patient has no known allergies.   Review of Systems Review of Systems  Constitutional: Negative for chills, diaphoresis, fatigue and fever.  Gastrointestinal: Positive for abdominal pain and constipation. Negative for blood in stool, diarrhea, nausea and vomiting.  Musculoskeletal: Negative for gait problem and myalgias.  Skin: Negative for rash.     Physical Exam Triage Vital Signs ED Triage Vitals  Enc Vitals Group     BP 07/24/20 1912 140/87     Pulse Rate 07/24/20 1912 98     Resp 07/24/20 1912 18     Temp 07/24/20 1912 98.4 F (36.9 C)     Temp Source 07/24/20 1912 Oral     SpO2 07/24/20 1912 96 %     Weight 07/24/20 1913 155 lb (70.3 kg)     Height 07/24/20 1913 5\' 7"  (1.702 m)     Head Circumference --      Peak Flow --      Pain Score 07/24/20 1912 6     Pain Loc --      Pain Edu? --      Excl. in GC? --    No data  found.  Updated Vital Signs BP 140/87 (BP Location: Right Arm)   Pulse 98   Temp 98.4 F (36.9 C) (Oral)   Resp 18   Ht 5\' 7"  (1.702 m)   Wt 155 lb (70.3 kg)   SpO2 96%   BMI 24.28 kg/m   Visual Acuity Right Eye Distance:   Left Eye Distance:   Bilateral Distance:    Right Eye Near:   Left Eye Near:    Bilateral Near:     Physical Exam Vitals and nursing note reviewed.  Constitutional:      General: He is not in acute distress.    Appearance: He is well-developed and normal weight. He is not toxic-appearing.  HENT:     Head: Normocephalic.  Eyes:     General: No scleral icterus. Pulmonary:     Effort: Pulmonary effort is normal.  Abdominal:     General: Abdomen is flat. Bowel sounds are normal.     Palpations: Abdomen is soft. There is no hepatomegaly or splenomegaly.     Tenderness: There is abdominal tenderness in the left lower quadrant. There is no guarding or rebound. Negative signs include Murphy's sign, McBurney's sign and psoas sign.      Comments: Has a large L inguinal hernia which was reduced fully when he laid down flat. LLQ tenderness was mild.   Skin:    General: Skin is warm and dry.  Neurological:     Mental Status: He is alert and oriented to person, place, and time.  Psychiatric:        Mood and Affect: Mood normal.        Behavior: Behavior normal.    UC Treatments / Results  Labs (all labs ordered are listed, but only abnormal results are displayed) Labs Reviewed - No data to display  EKG   Radiology No results found.  Procedures Procedures (including critical care time)  Medications Ordered in UC Medications - No data to display  Initial Impression / Assessment and Plan / UC Course  I have reviewed the triage vital signs and the nursing notes. Pt has chronic constipation which causes stool to get stuck into the L inguinal canal, but resolved right now for exception of residual pain. He want to take off tomorrow, so he can drink prune juice and finish emptying his bowels tomorrow. I educated him about diverticulitis and what to watch for and if his pain gets worse, needs to come back.  He refused any work up .   Final Clinical Impressions(s) / UC Diagnoses   Final diagnoses:  None   Discharge Instructions   None    ED Prescriptions    None     PDMP not reviewed this encounter.   [employer], Garey Ham 07/24/20 2007

## 2020-10-07 IMAGING — US US SCROTUM
1 series · 14 of 25 positions shown · non-contrast
Comparison: None.

CLINICAL DATA: Right testicular pain for the past 2 weeks.

EXAM:
SCROTAL ULTRASOUND
DOPPLER ULTRASOUND OF THE TESTICLES
TECHNIQUE: Complete ultrasound examination of the testicles, epididymis, and
other scrotal structures was performed. Color and spectral Doppler
ultrasound were also utilized to evaluate blood flow to the
testicles.

[Series 1: us scrotum · 0.07mm/px · 56 acquisitions, 14 frames shown]
[im 1/56]
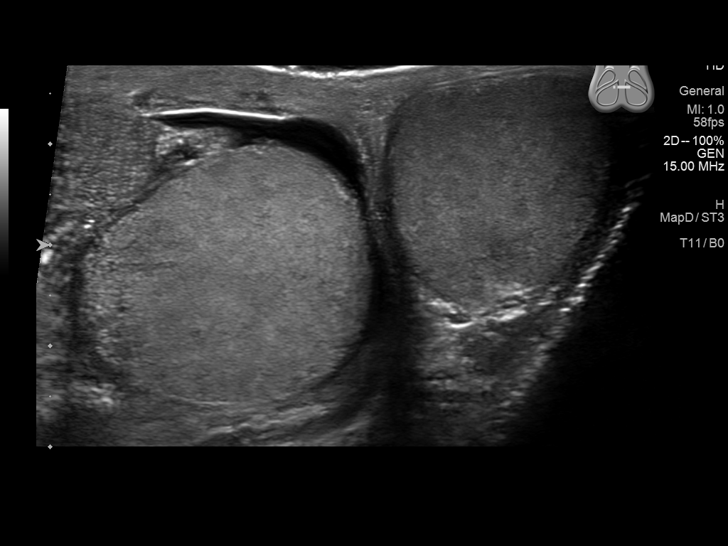
[im 5/56]
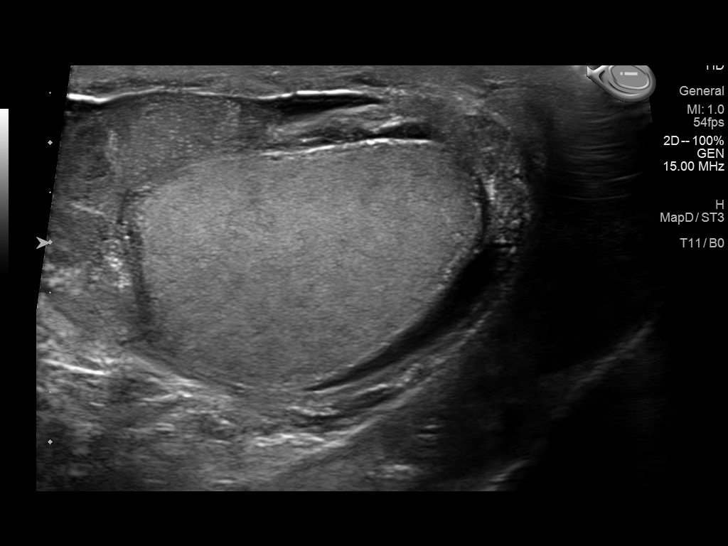
[im 10/56]
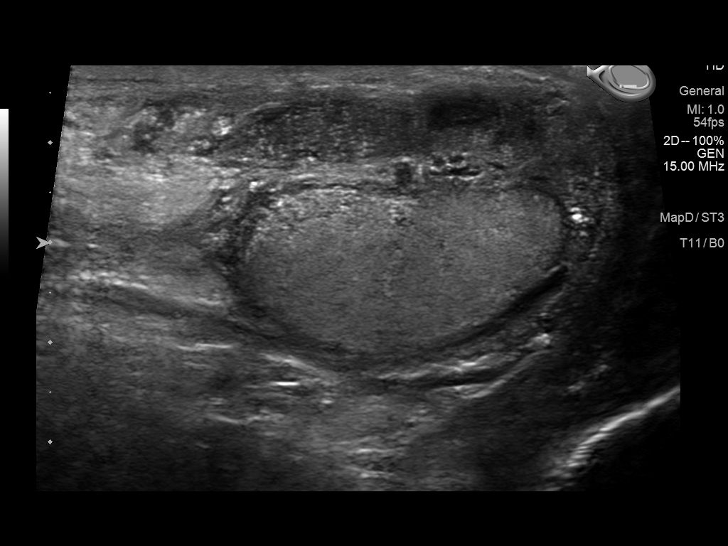
[im 14/56]
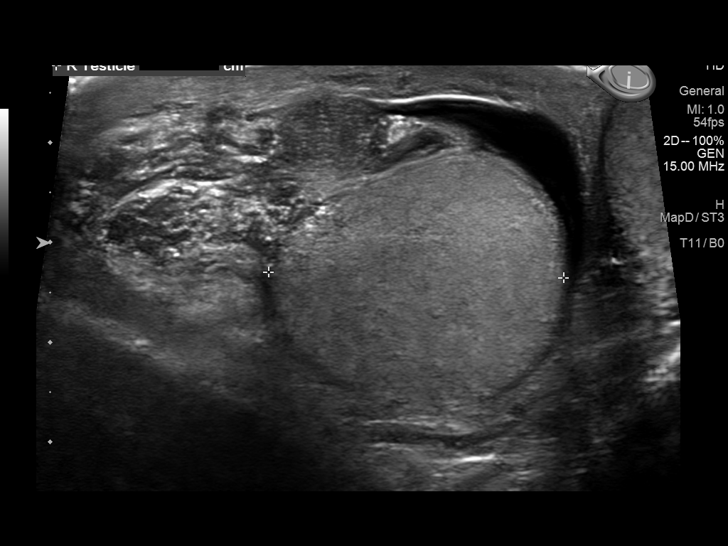
[im 19/56]
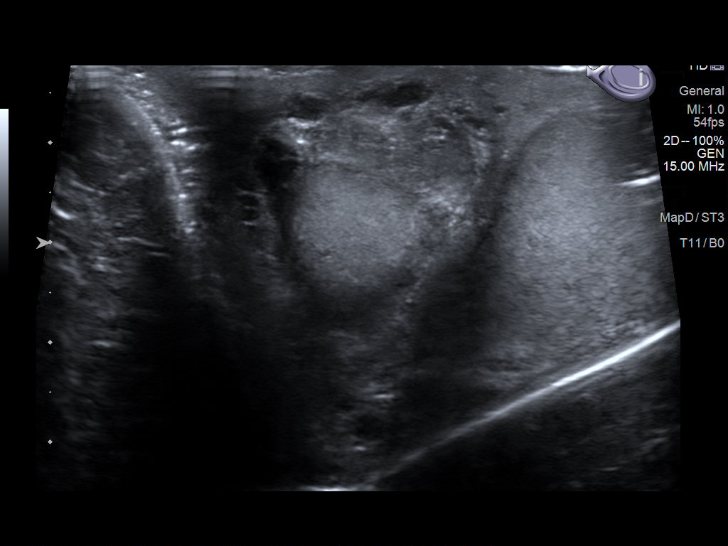
[im 21/56]
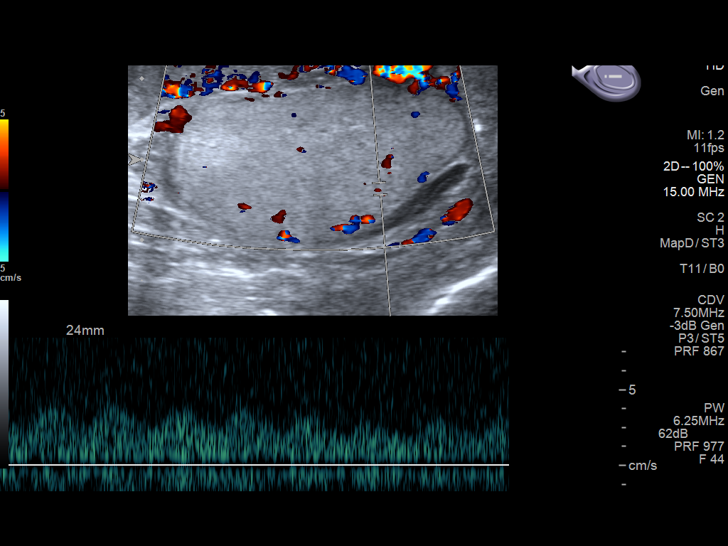
[im 26/56]
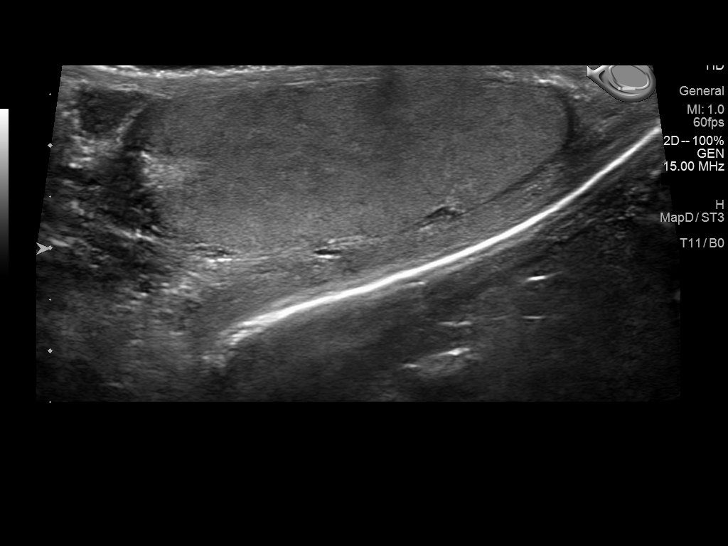
[im 30/56]
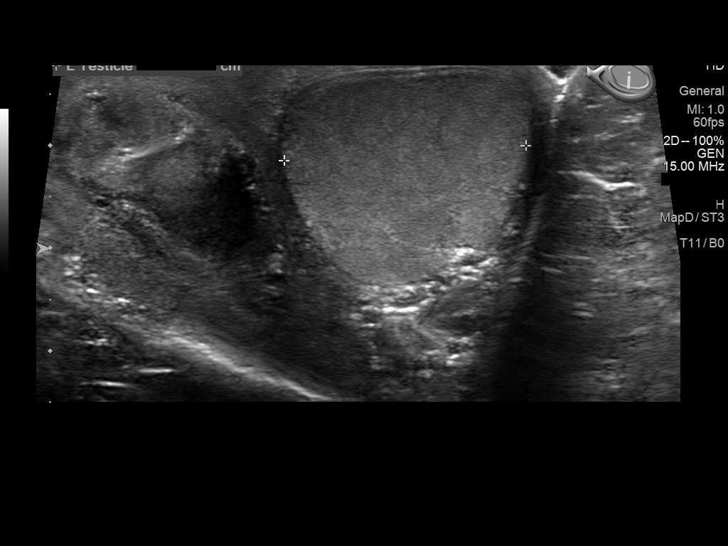
[im 35/56]
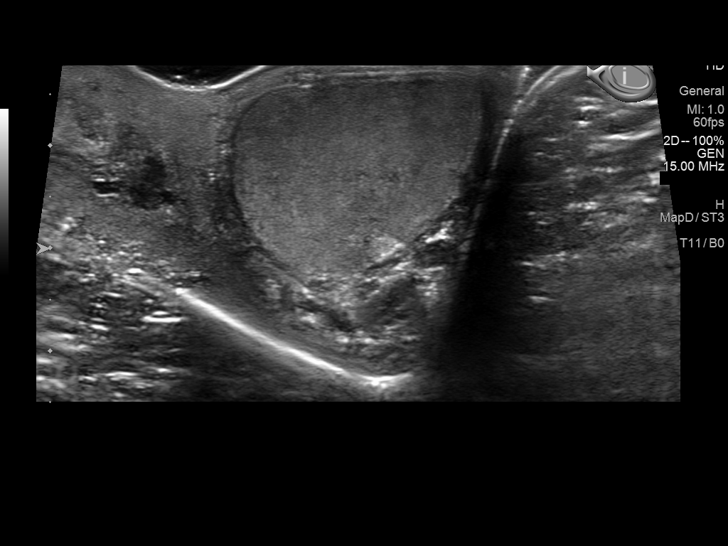
[im 37/56]
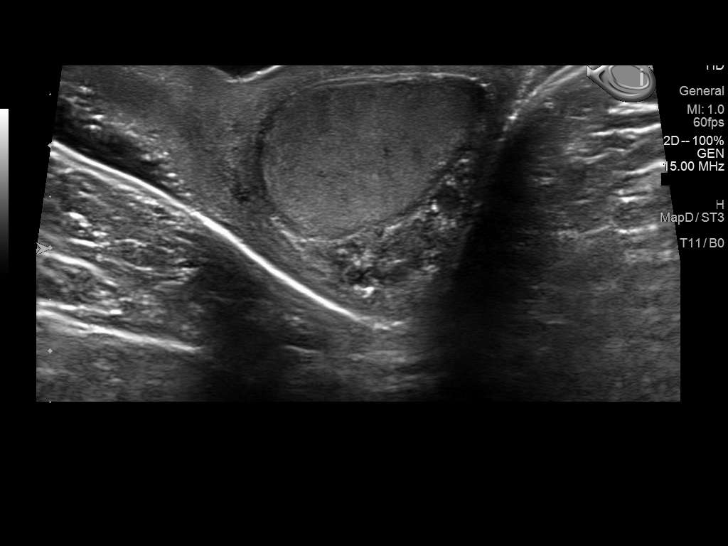
[im 42/56]
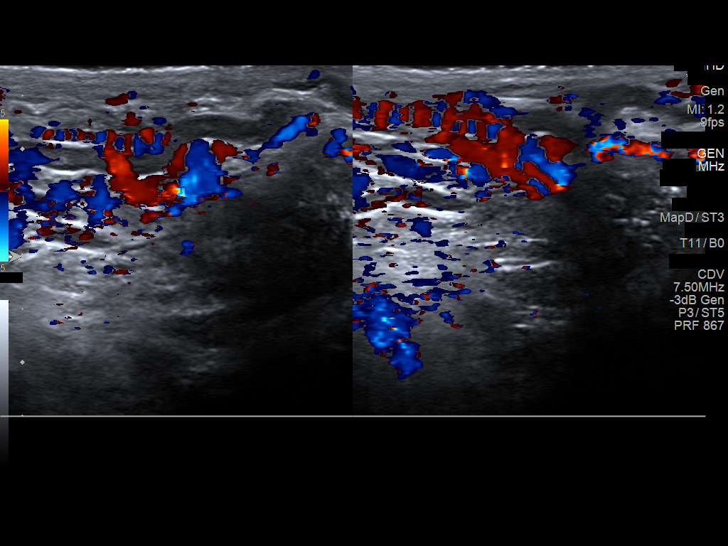
[im 46/56]
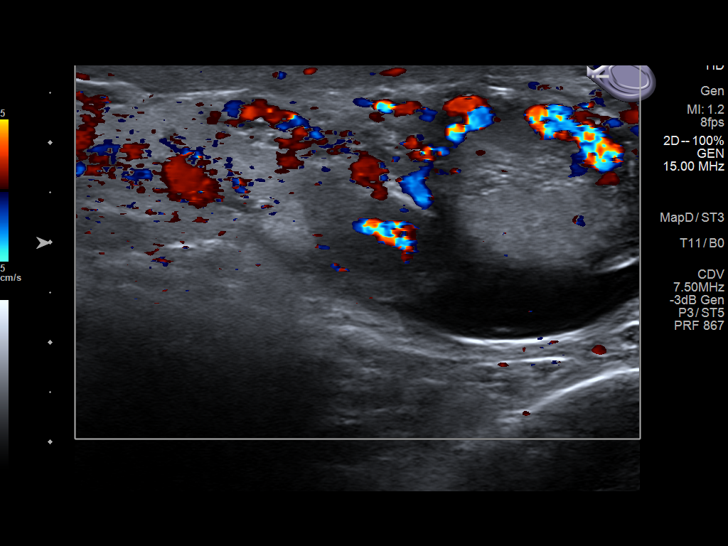
[im 51/56]
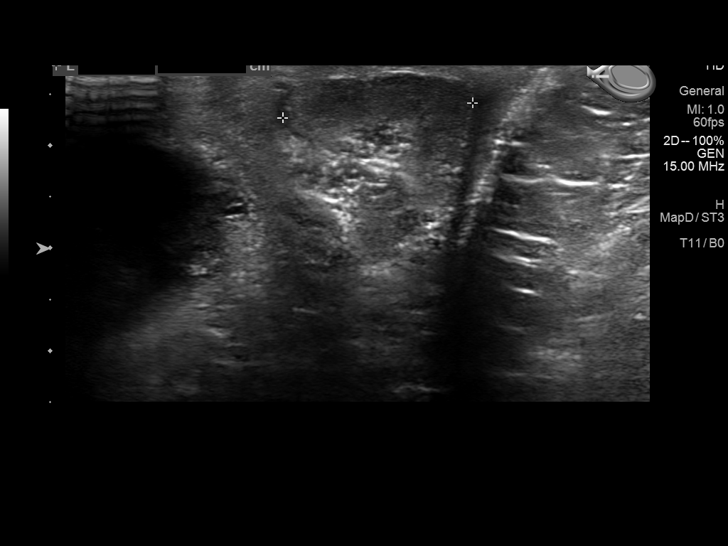
[im 56/56]
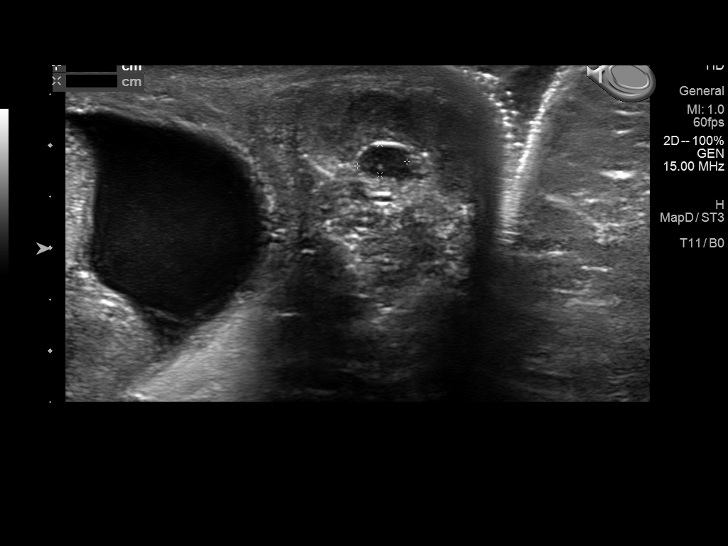

[14 of 25 positions shown; findings below may reference images not displayed]

FINDINGS: Right testicle

Measurements: 3.5 x 2.4 x 3.0 cm. No mass or microlithiasis
visualized.

Left testicle

Measurements: 4.1 x 2.0 x 2.4 cm. No mass or microlithiasis
visualized.

Right epididymis:  Enlarged and hyperemic.

Left epididymis: Normal in size and appearance. 5 mm epididymal
cyst.

Hydrocele:  Small right hydrocele.

Varicocele:  None visualized.

Pulsed Doppler interrogation of both testes demonstrates normal low
resistance arterial and venous waveforms bilaterally.
IMPRESSION: 1. Right epididymitis with small reactive right hydrocele.

## 2021-07-26 ENCOUNTER — Ambulatory Visit
Admission: EM | Admit: 2021-07-26 | Discharge: 2021-07-26 | Disposition: A | Discharge status: Home / Self Care | Payer: Federal, State, Local not specified - PPO | Attending: Physician Assistant | Admitting: Physician Assistant

## 2021-07-26 ENCOUNTER — Encounter: Payer: Self-pay | Admitting: Emergency Medicine

## 2021-07-26 ENCOUNTER — Other Ambulatory Visit: Payer: Self-pay

## 2021-07-26 DIAGNOSIS — J441 Chronic obstructive pulmonary disease with (acute) exacerbation: Secondary | ICD-10-CM | POA: Insufficient documentation

## 2021-07-26 DIAGNOSIS — Z20822 Contact with and (suspected) exposure to covid-19: Secondary | ICD-10-CM | POA: Diagnosis not present

## 2021-07-26 DIAGNOSIS — R051 Acute cough: Secondary | ICD-10-CM | POA: Diagnosis not present

## 2021-07-26 MED ORDER — AZITHROMYCIN 250 MG PO TABS: 250.0000 mg | ORAL_TABLET | Freq: Every day | ORAL | 0 refills | Status: DC

## 2021-07-26 MED ORDER — PREDNISONE 20 MG PO TABS: 40.0000 mg | ORAL_TABLET | Freq: Every day | ORAL | 0 refills | Status: AC

## 2021-07-26 NOTE — ED Provider Notes (Signed)
[facility]    CSN: [id number] Arrival date & time: 07/26/21  1307      History   Chief Complaint Chief Complaint  Patient presents with   Cough   Nasal Congestion    HPI Juan Herrera is a 65 y.o. male with history of COPD presenting for 4 to 5-day history of cough productive of brownish-yellow sputum, nasal congestion, fatigue.  Patient does not report any breathing difficulty.  No known COVID exposure but he has been around multiple family members who are sick.  Patient has been taking Mucinex and using his regular inhalers.  He denies any fevers or chest pain.  Other medical history significant for aortic valve replacement.  He takes Coumadin.  No other complaints.  HPI  Past Medical History:  Diagnosis Date   COPD (chronic obstructive pulmonary disease) (HCC)    History of chlamydia     There are no problems to display for this patient.   Past Surgical History:  Procedure Laterality Date   AORTIC VALVE REPLACEMENT         Home Medications    Prior to Admission medications   Medication Sig Start Date End Date Taking? Authorizing Provider  azithromycin (ZITHROMAX) 250 MG tablet Take 1 tablet (250 mg total) by mouth daily. Take first 2 tablets together, then 1 every day until finished. 07/26/21  Yes Eusebio Friendly B, PA-C  buPROPion (WELLBUTRIN) 100 MG tablet Take by mouth.   Yes [provider]  predniSONE (DELTASONE) 20 MG tablet Take 2 tablets (40 mg total) by mouth daily for 5 days. 07/26/21 07/31/21 Yes Eusebio Friendly B, PA-C  warfarin (COUMADIN) 7.5 MG tablet Take 7.5 mg by mouth daily. 7.5mg  X 5 days a week, 5mg  X 2 days a week   Yes [provider]    Family History Family History  Problem Relation Age of Onset   Healthy Mother        passed of old age   Healthy Father     Social History Social History   Tobacco Use   Smoking status: Never   Smokeless tobacco: Never  Vaping Use   Vaping Use: Never used   Substance Use Topics   Alcohol use: Yes    Comment: occassionaly   Drug use: Never     Allergies   Patient has no known allergies.   Review of Systems Review of Systems  Constitutional:  Positive for fatigue. Negative for fever.  HENT:  Positive for congestion and rhinorrhea. Negative for sinus pressure, sinus pain and sore throat.   Respiratory:  Positive for cough. Negative for shortness of breath.   Gastrointestinal:  Negative for abdominal pain, diarrhea, nausea and vomiting.  Musculoskeletal:  Negative for myalgias.  Neurological:  Negative for weakness, light-headedness and headaches.  Hematological:  Negative for adenopathy.    Physical Exam Triage Vital Signs ED Triage Vitals  Enc Vitals Group     BP 07/26/21 1340 135/84     Pulse Rate 07/26/21 1340 74     Resp 07/26/21 1340 16     Temp 07/26/21 1340 98.4 F (36.9 C)     Temp Source 07/26/21 1340 Oral     SpO2 07/26/21 1340 97 %     Weight 07/26/21 1338 155 lb (70.3 kg)     Height 07/26/21 1338 5\' 7"  (1.702 m)     Head Circumference --      Peak Flow --      Pain Score 07/26/21 1337 0  Pain Loc --      Pain Edu? --      Excl. in [place]? --    No data found.  Updated Vital Signs BP 135/84 (BP Location: Right Arm)   Pulse 74   Temp 98.4 F (36.9 C) (Oral)   Resp 16   Ht 5\' 7"  (1.702 m)   Wt 155 lb (70.3 kg)   SpO2 97%   BMI 24.28 kg/m      Physical Exam Vitals and nursing note reviewed.  Constitutional:      General: He is not in acute distress.    Appearance: Normal appearance. He is well-developed. He is not ill-appearing or diaphoretic.  HENT:     Head: Normocephalic and atraumatic.     Nose: Congestion present.     Mouth/Throat:     Mouth: Mucous membranes are moist.     Pharynx: Oropharynx is clear. Uvula midline. No oropharyngeal exudate.     Tonsils: No tonsillar abscesses.  Eyes:     General: No scleral icterus.       Right eye: No discharge.        Left eye: No discharge.      Conjunctiva/sclera: Conjunctivae normal.  Neck:     Thyroid: No thyromegaly.     Trachea: No tracheal deviation.  Cardiovascular:     Rate and Rhythm: Normal rate and regular rhythm.     Heart sounds: Murmur (systolic murmur) heard.  Pulmonary:     Effort: Pulmonary effort is normal. No respiratory distress.     Breath sounds: Normal breath sounds. No wheezing, rhonchi or rales.  Musculoskeletal:     Cervical back: Normal range of motion and neck supple.  Lymphadenopathy:     Cervical: No cervical adenopathy.  Skin:    General: Skin is warm and dry.     Findings: No rash.  Neurological:     General: No focal deficit present.     Mental Status: He is alert. Mental status is at baseline.     Motor: No weakness.     Coordination: Coordination normal.     Gait: Gait normal.  Psychiatric:        Mood and Affect: Mood normal.        Behavior: Behavior normal.        Thought Content: Thought content normal.     UC Treatments / Results  Labs (all labs ordered are listed, but only abnormal results are displayed) Labs Reviewed  SARS CORONAVIRUS 2 (TAT 6-24 HRS)    EKG   Radiology No results found.  Procedures Procedures (including critical care time)  Medications Ordered in UC Medications - No data to display  Initial Impression / Assessment and Plan / UC Course  I have reviewed the triage vital signs and the nursing notes.  Pertinent labs & imaging results that were available during my care of the patient were reviewed by me and considered in my medical decision making (see chart for details).  65 year old male with history of asthma and COPD is presenting for 4 to 5-day history of productive cough.  Vitals normal and stable and he is overall well-appearing.  Mild nasal congestion on exam.  Chest clear to auscultation.  PCR COVID test obtained.  Current CDC guidelines, isolation protocol and ED precautions reviewed with patient.  Advised patient that we will call him  and set that up if positive.  Since he does have COPD and has a new worsening cough, treating for COPD exacerbation with  prednisone and azithromycin.  He says he has had this before and it did not affect his INRs.  He keeps regular check on those.  Advised patient to increase fluids and take Mucinex.  He should follow-up for any fever, shortness of breath or worsening symptoms.  Final Clinical Impressions(s) / UC Diagnoses   Final diagnoses:  COPD exacerbation (HCC)  Acute cough     Discharge Instructions      You have received COVID testing today either for positive exposure, concerning symptoms that could be related to COVID infection, screening purposes, or re-testing after confirmed positive.  Your test obtained today checks for active viral infection in the last 1-2 weeks. If your test is negative now, you can still test positive later. So, if you do develop symptoms you should either get re-tested and/or isolate x 5 days and then strict mask use x 5 days (unvaccinated) or mask use x 10 days (vaccinated). Please follow CDC guidelines.  While Rapid antigen tests come back in 15-20 minutes, send out PCR/molecular test results typically come back within 1-3 days. In the mean time, if you are symptomatic, assume this could be a positive test and treat/monitor yourself as if you do have COVID.   We will call with test results if positive. Please download the MyChart app and set up a profile to access test results.   If symptomatic, go home and rest. Push fluids. Take Tylenol as needed for discomfort. Gargle warm salt water. Throat lozenges. Take Mucinex DM or Robitussin for cough. Humidifier in bedroom to ease coughing. Warm showers. Also review the COVID handout for more information.  COVID-19 INFECTION: The incubation period of COVID-19 is approximately 14 days after exposure, with most symptoms developing in roughly 4-5 days. Symptoms may range in severity from mild to critically severe.  Roughly 80% of those infected will have mild symptoms. People of any age may become infected with COVID-19 and have the ability to transmit the virus. The most common symptoms include: fever, fatigue, cough, body aches, headaches, sore throat, nasal congestion, shortness of breath, nausea, vomiting, diarrhea, changes in smell and/or taste.    COURSE OF ILLNESS Some patients may begin with mild disease which can progress quickly into critical symptoms. If your symptoms are worsening please call ahead to the Emergency Department and proceed there for further treatment. Recovery time appears to be roughly 1-2 weeks for mild symptoms and 3-6 weeks for severe disease.   GO IMMEDIATELY TO ER FOR FEVER YOU ARE UNABLE TO GET DOWN WITH TYLENOL, BREATHING PROBLEMS, CHEST PAIN, FATIGUE, LETHARGY, INABILITY TO EAT OR DRINK, ETC  QUARANTINE AND ISOLATION: To help decrease the spread of COVID-19 please remain isolated if you have COVID infection or are highly suspected to have COVID infection. This means -stay home and isolate to one room in the home if you live with others. Do not share a bed or bathroom with others while ill, sanitize and wipe down all countertops and keep common areas clean and disinfected. Stay home for 5 days. If you have no symptoms or your symptoms are resolving after 5 days, you can leave your house. Continue to wear a mask around others for 5 additional days. If you have been in close contact (within 6 feet) of someone diagnosed with COVID 19, you are advised to quarantine in your home for 14 days as symptoms can develop anywhere from 2-14 days after exposure to the virus. If you develop symptoms, you  must isolate.  Most current  guidelines for COVID after exposure -unvaccinated: isolate 5 days and strict mask use x 5 days. Test on day 5 is possible -vaccinated: wear mask x 10 days if symptoms do not develop -You do not necessarily need to be tested for COVID if you have + exposure and   develop symptoms. Just isolate at home x10 days from symptom onset During this global pandemic, CDC advises to practice social distancing, try to stay at least 60ft away from others at all times. Wear a face covering. Wash and sanitize your hands regularly and avoid going anywhere that is not necessary.  KEEP IN MIND THAT THE COVID TEST IS NOT 100% ACCURATE AND YOU SHOULD STILL DO EVERYTHING TO PREVENT POTENTIAL SPREAD OF VIRUS TO OTHERS (WEAR MASK, WEAR GLOVES, WASH HANDS AND SANITIZE REGULARLY). IF INITIAL TEST IS NEGATIVE, THIS MAY NOT MEAN YOU ARE DEFINITELY NEGATIVE. MOST ACCURATE TESTING IS DONE 5-7 DAYS AFTER EXPOSURE.   It is not advised by CDC to get re-tested after receiving a positive COVID test since you can still test positive for weeks to months after you have already cleared the virus.   *If you have not been vaccinated for COVID, I strongly suggest you consider getting vaccinated as long as there are no contraindications.       ED Prescriptions     Medication Sig Dispense Auth. Provider   azithromycin (ZITHROMAX) 250 MG tablet Take 1 tablet (250 mg total) by mouth daily. Take first 2 tablets together, then 1 every day until finished. 6 tablet Eusebio Friendly B, PA-C   predniSONE (DELTASONE) 20 MG tablet Take 2 tablets (40 mg total) by mouth daily for 5 days. 10 tablet Gareth Morgan      PDMP not reviewed this encounter.   Shirlee Latch, PA-C 07/26/21 1512

## 2021-07-26 NOTE — ED Triage Notes (Signed)
Patient c/o cough, nasal congestion and runny nose that started on Thursday.  Patient has not checked his temperature.

## 2021-07-26 NOTE — Discharge Instructions (Addendum)

## 2021-07-27 LAB — SARS CORONAVIRUS 2 (TAT 6-24 HRS): SARS Coronavirus 2: NEGATIVE

## 2021-12-27 ENCOUNTER — Ambulatory Visit
Admission: EM | Admit: 2021-12-27 | Discharge: 2021-12-27 | Disposition: A | Discharge status: Home / Self Care | Payer: Federal, State, Local not specified - PPO | Attending: Emergency Medicine | Admitting: Emergency Medicine

## 2021-12-27 ENCOUNTER — Encounter: Payer: Self-pay | Admitting: Emergency Medicine

## 2021-12-27 ENCOUNTER — Other Ambulatory Visit: Payer: Self-pay

## 2021-12-27 DIAGNOSIS — H1013 Acute atopic conjunctivitis, bilateral: Secondary | ICD-10-CM | POA: Diagnosis not present

## 2021-12-27 MED ORDER — POLYMYXIN B-TRIMETHOPRIM 10000-0.1 UNIT/ML-% OP SOLN: 1.0000 [drp] | OPHTHALMIC | 0 refills | Status: DC

## 2021-12-27 NOTE — ED Triage Notes (Signed)
Patient c/o drainage and irritation in both eyes that started this morning.  ?

## 2021-12-27 NOTE — ED Provider Notes (Signed)
?[facility] ? ? ? ?CSN: [id number] ?Arrival date & time: 12/27/21  1428 ? ? ?  ? ?History   ?Chief Complaint ?Chief Complaint  ?Patient presents with  ? Eye Problem  ?  bilateral  ? ? ?HPI ?Elzo Knupp is a 66 y.o. male.  ? ?Patient presents with bilateral sensation of foreign body, increased tearing, sensation that the sclera is swollen sensation of a foreign body, feels like the sclera is swollen beginning this morning.  Denies eye redness, light sensitivity, blurred vision.  Has not attempted treatment of symptoms.  Endorses history of seasonal allergies but has never felt eye symptoms, not currently taking medications.  Denies fever, chills, body aches, URI symptoms. ? ?Past Medical History:  ?Diagnosis Date  ? COPD (chronic obstructive pulmonary disease) ([place])   ? History of chlamydia   ? ? ?There are no problems to display for this patient. ? ? ?Past Surgical History:  ?Procedure Laterality Date  ? AORTIC VALVE REPLACEMENT    ? ? ? ? ? ?Home Medications   ? ?Prior to Admission medications   ?Medication Sig Start Date End Date Taking? Authorizing Provider  ?buPROPion (WELLBUTRIN) 100 MG tablet Take by mouth.   Yes [provider]  ?warfarin (COUMADIN) 7.5 MG tablet Take 7.5 mg by mouth daily. 7.5mg  X 5 days a week, 5mg  X 2 days a week   Yes [provider]  ?azithromycin (ZITHROMAX) 250 MG tablet Take 1 tablet (250 mg total) by mouth daily. Take first 2 tablets together, then 1 every day until finished. 07/26/21   Danton Clap, PA-C  ? ? ?Family History ?Family History  ?Problem Relation Age of Onset  ? Healthy Mother   ?     passed of old age  ? Healthy Father   ? ? ?Social History ?Social History  ? ?Tobacco Use  ? Smoking status: Never  ? Smokeless tobacco: Never  ?Vaping Use  ? Vaping Use: Never used  ?Substance Use Topics  ? Alcohol use: Yes  ?  Comment: occassionaly  ? Drug use: Never  ? ? ? ?Allergies   ?Patient has no known allergies. ? ? ?Review of  Systems ?Review of Systems  ?Constitutional: Negative.   ?HENT: Negative.    ?Eyes:  Positive for pain and discharge. Negative for photophobia, redness, itching and visual disturbance.  ?Cardiovascular: Negative.   ?Skin: Negative.   ? ? ?Physical Exam ?Triage Vital Signs ?ED Triage Vitals  ?Enc Vitals Group  ?   BP 12/27/21 1452 (!) 171/100  ?   Pulse Rate 12/27/21 1452 74  ?   Resp 12/27/21 1452 15  ?   Temp 12/27/21 1452 98.2 ?F (36.8 ?C)  ?   Temp Source 12/27/21 1452 Oral  ?   SpO2 12/27/21 1452 95 %  ?   Weight 12/27/21 1451 155 lb (70.3 kg)  ?   Height 12/27/21 1451 5\' 7"  (1.702 m)  ?   Head Circumference --   ?   Peak Flow --   ?   Pain Score 12/27/21 1451 6  ?   Pain Loc --   ?   Pain Edu? --   ?   Excl. in [place]? --   ? ?No data found. ? ?Updated Vital Signs ?BP (!) 171/100 (BP Location: Left Arm)   Pulse 74   Temp 98.2 ?F (36.8 ?C) (Oral)   Resp 15   Ht 5\' 7"  (1.702 m)   Wt 155 lb (70.3 kg)  SpO2 95%   BMI 24.28 kg/m?  ? ?Visual Acuity ?Right Eye Distance:   ?Left Eye Distance:   ?Bilateral Distance:   ? ?Right Eye Near:   ?Left Eye Near:    ?Bilateral Near:    ? ?Physical Exam ?Constitutional:   ?   Appearance: Normal appearance.  ?HENT:  ?   Head: Normocephalic.  ?Eyes:  ?   Comments: No erythema noted to the conjunctiva, sclera, no drainage noted, vision grossly intact, extraocular movements intact  ?Pulmonary:  ?   Effort: Pulmonary effort is normal.  ?Skin: ?   General: Skin is warm and dry.  ?Neurological:  ?   Mental Status: He is alert and oriented to person, place, and time. Mental status is at baseline.  ?Psychiatric:     ?   Mood and Affect: Mood normal.     ?   Behavior: Behavior normal.  ? ? ? ?UC Treatments / Results  ?Labs ?(all labs ordered are listed, but only abnormal results are displayed) ?Labs Reviewed - No data to display ? ?EKG ? ? ?Radiology ?No results found. ? ?Procedures ?Procedures (including critical care time) ? ?Medications Ordered in UC ?Medications - No data to  display ? ?Initial Impression / Assessment and Plan / UC Course  ?I have reviewed the triage vital signs and the nursing notes. ? ?Pertinent labs & imaging results that were available during my care of the patient were reviewed by me and considered in my medical decision making (see chart for details). ? ?Conjunctivitis of both eyes ? ?Etiology of symptoms are most seasonal allergies, discussed with patient however will cover for bacteria, Polytrim 7-day course prescribed, recommended patient take oral antihistamine daily until allergy season has subsided, may use cool compresses, Tylenol, ibuprofen for additional comfort, for persisting symptoms may follow-up with urgent care or ophthalmologist as needed ?Final Clinical Impressions(s) / UC Diagnoses  ? ?Final diagnoses:  ?None  ? ?Discharge Instructions   ?None ?  ? ?ED Prescriptions   ?None ?  ? ?PDMP not reviewed this encounter. ?  ?Hans Eden, NP ?12/27/21 1522 ? ?

## 2021-12-27 NOTE — Discharge Instructions (Signed)
Today I believe your symptoms to be more allergy related however we will trial antibiotic use to see if that assists with symptoms ? ?To cover for bacteria,  Place 1 drop of Polytrim into each eye every 4 hours for the next 7 days ? ?Begin use of a daily allergy medicine such as Claritin or Zyrtec ? ?You may apply cool compresses to your eyes for additional comfort and to remove drainage ? ?Avoid any eye rubbing or touching as this may cause further irritation ? ?Symptoms continue past use of medication you may follow-up with urgent care as needed for reevaluation of your eyes, you may also follow-up with any of the local eye practices ?

## 2022-01-09 ENCOUNTER — Other Ambulatory Visit: Payer: Self-pay

## 2022-01-09 ENCOUNTER — Other Ambulatory Visit
Admission: RE | Admit: 2022-01-09 | Discharge: 2022-01-09 | Disposition: A | Discharge status: Home / Self Care | Payer: Federal, State, Local not specified - PPO | Source: Ambulatory Visit | Attending: Surgery | Admitting: Surgery

## 2022-01-09 ENCOUNTER — Ambulatory Visit: Payer: Self-pay | Admitting: Surgery

## 2022-01-09 DIAGNOSIS — Z01812 Encounter for preprocedural laboratory examination: Secondary | ICD-10-CM

## 2022-01-09 HISTORY — DX: Long term (current) use of anticoagulants: Z79.01

## 2022-01-09 HISTORY — DX: Hyperlipidemia, unspecified: E78.5

## 2022-01-09 HISTORY — DX: Dissection of unspecified site of aorta: I71.00

## 2022-01-09 HISTORY — DX: Anemia, unspecified: D64.9

## 2022-01-09 HISTORY — DX: Disappearance and death of family member: Z63.4

## 2022-01-09 HISTORY — DX: Major depressive disorder, single episode, unspecified: F32.9

## 2022-01-09 HISTORY — DX: Sarcoidosis, unspecified: D86.9

## 2022-01-09 HISTORY — DX: Atherosclerotic heart disease of native coronary artery without angina pectoris: I25.10

## 2022-01-09 HISTORY — DX: Alcohol abuse, uncomplicated: F10.10

## 2022-01-09 HISTORY — DX: Post-traumatic stress disorder, unspecified: F43.10

## 2022-01-09 HISTORY — DX: Flat foot (pes planus) (acquired), unspecified foot: M21.40

## 2022-01-09 HISTORY — DX: Essential (primary) hypertension: I10

## 2022-01-09 HISTORY — DX: Old myocardial infarction: I25.2

## 2022-01-09 HISTORY — DX: Depression, unspecified: F32.A

## 2022-01-09 HISTORY — DX: Dorsalgia, unspecified: M54.9

## 2022-01-09 HISTORY — DX: Unilateral inguinal hernia, without obstruction or gangrene, not specified as recurrent: K40.90

## 2022-01-09 HISTORY — DX: Cocaine dependence, in remission: F14.21

## 2022-01-09 HISTORY — DX: Unspecified atrial fibrillation: I48.91

## 2022-01-09 HISTORY — DX: Nonrheumatic aortic (valve) insufficiency: I35.1

## 2022-01-09 HISTORY — DX: Abnormal electrocardiogram (ECG) (EKG): R94.31

## 2022-01-09 HISTORY — DX: Postprocedural seroma of skin and subcutaneous tissue following a dermatologic procedure: L76.33

## 2022-01-09 HISTORY — DX: Trichomoniasis, unspecified: A59.9

## 2022-01-09 HISTORY — DX: Insomnia, unspecified: G47.00

## 2022-01-09 HISTORY — DX: Cough, unspecified: R05.9

## 2022-01-09 NOTE — Patient Instructions (Addendum)
Your procedure is scheduled on: 01/16/22 - Thursday ?Report to the Registration Desk on the 1st floor of the [facility] - [address] [place] ?To find out your arrival time, please call [phone] between 1PM - 3PM on: 01/15/22 - Wednesday ? ?REMEMBER: ?Instructions that are not followed completely may result in serious medical risk, up to and including death; or upon the discretion of your surgeon and anesthesiologist your surgery may need to be rescheduled. ? ?Do not eat food after midnight the night before surgery.  ?No gum chewing, lozengers or hard candies. ? ?You may however, drink CLEAR liquids up to 2 hours before you are scheduled to arrive for your surgery. Do not drink anything within 2 hours of your scheduled arrival time. ? ?Clear liquids include: ?- water  ?- apple juice without pulp ?- gatorade (not RED colors) ?- black coffee or tea (Do NOT add milk or creamers to the coffee or tea) ?Do NOT drink anything that is not on this list. ? ?TAKE ONLY THESE MEDICATIONS THE MORNING OF SURGERY WITH A SIP OF WATER: ? ?- buPROPion [facility]) ? ?Follow recommendations from Cardiologist, Pulmonologist or PCP regarding stopping Aspirin, Coumadin, Plavix, Eliquis, Pradaxa, or Pletal. Follow instructions given to you by [facility]. ? ?One week prior to surgery: ?Stop Anti-inflammatories (NSAIDS) such as Advil, Aleve, Ibuprofen, Motrin, Naproxen, Naprosyn and Aspirin based products such as Excedrin, Goodys Powder, BC Powder. ? ?Stop ANY OVER THE COUNTER supplements until after surgery. ? ?You may take Tylenol if needed for pain up until the day of surgery. ? ?No Alcohol for 24 hours before or after surgery. ? ?No Smoking including e-cigarettes for 24 hours prior to surgery.  ?No chewable tobacco products for at least 6 hours prior to surgery.  ?No nicotine patches on the day of surgery. ? ?Do not use any "recreational" drugs for at least a week prior to your surgery.  ?Please be  advised that the combination of cocaine and anesthesia may have negative outcomes, up to and including death. ?If you test positive for cocaine, your surgery will be cancelled. ? ?On the morning of surgery brush your teeth with toothpaste and water, you may rinse your mouth with mouthwash if you wish. ?Do not swallow any toothpaste or mouthwash. ? ?Use CHG Soap or wipes as directed on instruction sheet. ? ?Do not wear jewelry, make-up, hairpins, clips or nail polish. ? ?Do not wear lotions, powders, or perfumes.  ? ?Do not shave body from the neck down 48 hours prior to surgery just in case you cut yourself which could leave a site for infection.  ?Also, freshly shaved skin may become irritated if using the CHG soap. ? ?Contact lenses, hearing aids and dentures may not be worn into surgery. ? ?Do not bring valuables to the hospital. [facility] is not responsible for any missing/lost belongings or valuables.  ? ?Notify your doctor if there is any change in your medical condition (cold, fever, infection). ? ?Wear comfortable clothing (specific to your surgery type) to the hospital. ? ?After surgery, you can help prevent lung complications by doing breathing exercises.  ?Take deep breaths and cough every 1-2 hours. Your doctor may order a device called an Incentive Spirometer to help you take deep breaths. ?When coughing or sneezing, hold a pillow firmly against your incision with both hands. This is called ?splinting.? Doing this helps protect your incision. It also decreases belly discomfort. ? ?If you are being admitted to  the hospital overnight, leave your suitcase in the car. ?After surgery it may be brought to your room. ? ?If you are being discharged the day of surgery, you will not be allowed to drive home. ?You will need a responsible adult (18 years or older) to drive you home and stay with you that night.  ? ?If you are taking public transportation, you will need to have a responsible adult (18 years or  older) with you. ?Please confirm with your physician that it is acceptable to use public transportation.  ? ?Please call the Pre-admissions Testing Dept. at [phone] if you have any questions about these instructions. ? ?Surgery Visitation Policy: ? ?Patients undergoing a surgery or procedure may have two family members or support persons with them as long as the person is not COVID-19 positive or experiencing its symptoms.  ? ?Inpatient Visitation:   ? ?Visiting hours are 7 a.m. to 8 p.m. ?Up to four visitors are allowed at one time in a patient room, including children. The visitors may rotate out with other people during the day. One designated support person (adult) may remain overnight.  ?

## 2022-01-09 NOTE — H&P (Signed)
Subjective:  ? ?CC: Non-recurrent unilateral inguinal hernia without obstruction or gangrene [K40.90] ? ?HPI: ?Juan Herrera is a 66 y.o. male who was referred by Vonita Moss for evaluation of above. Symptoms were first noted several weeks ago. Pain is sharp and intermittent, confined to the right groin, without radiation. Associated with nothing, exacerbated by pressure. Lump is reducible.  ? 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 ? ?Past Surgical History:  ?Past Surgical History:  ?Procedure Laterality Date  ? Type A dissection repair 2003  [place], mechanical AVR and proximal aortic repair  ? ?Family History: family history is not on file. ? ?Social History: reports that he has quit smoking. His smoking use included cigarettes. He has never used smokeless tobacco. He reports that he does not currently use drugs. No history on file for alcohol use. ? ?Current Medications: has a current medication list which includes the following prescription(s): acetaminophen, albuterol, bupropion hcl, cetirizine, chlorthalidone, hydroxychloroquine, metoprolol tartrate, mirtazapine, miscellaneous medical supply, rosuvastatin, sildenafil, sumatriptan, warfarin, and UNKNOWN TO PATIENT. ? ?Allergies:  ?Allergies as of 12/25/2021  ? (No Known Allergies)  ? ?ROS:  ?A 15 point review of systems was performed and pertinent positives and negatives noted in HPI ? ?Objective:  ? ? ?BP (!) 160/103  Pulse 79  Ht 170.2 cm (5\' 7" )  Wt 69.4 kg (153 lb)  BMI 23.96  kg/m?  ? ?Constitutional : Alert, cooperative, no distress  ?Lymphatics/Throat: Supple, no lymphadenopathy  ?Respiratory: clear to auscultation bilaterally  ?Cardiovascular: regular rate and rhythm  ?Gastrointestinal: soft, non-tender; bowel sounds normal; no masses, no organomegaly. inguinal hernia noted. large, reducible, no overlying skin changes and RIGHT  ?Musculoskeletal: Steady gait and movement  ?Skin: Cool and moist, no surgical scars  ?Psychiatric: Normal affect, non-agitated, not confused  ? ? ?LABS:  ?n/a  ? ?RADS: ?n/a ?Assessment:  ? ? ?Non-recurrent unilateral inguinal hernia without obstruction or gangrene [K40.90] ? ?Plan:  ? ? ?1. Non-recurrent unilateral inguinal hernia without obstruction or gangrene [K40.90]  ?Discussed the risk of surgery including recurrence, which can be up to 50% in the case of incisional or complex hernias, possible use of prosthetic materials (mesh) and the increased risk of mesh infxn if used, bleeding, chronic pain, post-op infxn, post-op SBO or ileus, and possible re-operation to address said risks. The risks of general anesthetic, if used, includes MI, CVA, sudden death or even reaction to anesthetic medications also discussed. Alternatives include continued observation. Benefits include possible symptom relief, prevention of incarceration, strangulation, enlargement in size over time, and the risk of emergency surgery in the face of strangulation.  ? ?Typical post-op recovery time of 3-5 days with 2 weeks of activity restrictions were also discussed. ? ?ED return precautions given for sudden increase in pain, size of hernia with accompanying fever, nausea, and/or vomiting. ? ?The patient verbalized understanding and all questions were answered to the patient's satisfaction. ? ?2. Patient has elected to proceed with surgical treatment. Procedure will be scheduled. Pending medical risk stratification. hx of advanced COPD and sarcoidosis per [facility] records. despite previous  report, he is not requiring oxygen nor steroids, nor has chronic cough issues. will reach out to anesthesia to  further eval. Have also asked pt to reach out to [place] pulm office to see if they can see sooner for risk stratification. Hope is to proceed with surgery by mid April. ? ?If high risk for general anesthesia, can proceed with open repair under spinal, but will need to stay off coumadin for 5days prior and also obtain INR at time of surgery ? ?labs/images/medications/previous chart entries reviewed personally and relevant changes/updates noted above. ?

## 2022-01-10 ENCOUNTER — Encounter: Payer: Self-pay | Admitting: Anesthesiology

## 2022-01-13 ENCOUNTER — Other Ambulatory Visit: Admission: RE | Admit: 2022-01-13 | Payer: No Typology Code available for payment source | Source: Ambulatory Visit

## 2022-01-13 NOTE — Pre-Procedure Instructions (Signed)
Called and spoke with Juan Herrera at Dr Geoffery Lyons office about pt needing either cardiac OR medical clearance per dr Pernell Dupre. Since pt has all of his doctors at the [place], [name] will get pt set up with cardiology at [facility]. [name] said I do not need to fax over request for clearance. She will go ahead and get this set up ?

## 2022-01-14 ENCOUNTER — Inpatient Hospital Stay: Admission: RE | Admit: 2022-01-14 | Payer: Federal, State, Local not specified - PPO | Source: Ambulatory Visit

## 2022-01-15 ENCOUNTER — Emergency Department: Payer: No Typology Code available for payment source

## 2022-01-15 ENCOUNTER — Inpatient Hospital Stay
Admission: RE | Admit: 2022-01-15 | Discharge: 2022-01-15 | Disposition: A | Discharge status: Home / Self Care | Payer: Federal, State, Local not specified - PPO | Source: Ambulatory Visit | Attending: Surgery | Admitting: Surgery

## 2022-01-15 ENCOUNTER — Other Ambulatory Visit: Payer: Self-pay

## 2022-01-15 ENCOUNTER — Encounter: Payer: Self-pay | Admitting: Emergency Medicine

## 2022-01-15 ENCOUNTER — Inpatient Hospital Stay
Admission: EM | Admit: 2022-01-15 | Discharge: 2022-01-16 | DRG: 299 | Disposition: A | Discharge status: Hospice | Payer: No Typology Code available for payment source | Source: Ambulatory Visit | Attending: Internal Medicine | Admitting: Internal Medicine

## 2022-01-15 DIAGNOSIS — I4891 Unspecified atrial fibrillation: Secondary | ICD-10-CM | POA: Diagnosis present

## 2022-01-15 DIAGNOSIS — Z20822 Contact with and (suspected) exposure to covid-19: Secondary | ICD-10-CM | POA: Diagnosis present

## 2022-01-15 DIAGNOSIS — I252 Old myocardial infarction: Secondary | ICD-10-CM | POA: Diagnosis not present

## 2022-01-15 DIAGNOSIS — J449 Chronic obstructive pulmonary disease, unspecified: Secondary | ICD-10-CM | POA: Diagnosis present

## 2022-01-15 DIAGNOSIS — J9621 Acute and chronic respiratory failure with hypoxia: Secondary | ICD-10-CM | POA: Diagnosis not present

## 2022-01-15 DIAGNOSIS — I7100 Dissection of unspecified site of aorta: Secondary | ICD-10-CM | POA: Diagnosis present

## 2022-01-15 DIAGNOSIS — Z7901 Long term (current) use of anticoagulants: Secondary | ICD-10-CM

## 2022-01-15 DIAGNOSIS — I7112 Aneurysm of the aortic arch, ruptured: Secondary | ICD-10-CM | POA: Diagnosis present

## 2022-01-15 DIAGNOSIS — E785 Hyperlipidemia, unspecified: Secondary | ICD-10-CM | POA: Diagnosis present

## 2022-01-15 DIAGNOSIS — Z515 Encounter for palliative care: Secondary | ICD-10-CM

## 2022-01-15 DIAGNOSIS — Z952 Presence of prosthetic heart valve: Secondary | ICD-10-CM | POA: Diagnosis not present

## 2022-01-15 DIAGNOSIS — I10 Essential (primary) hypertension: Secondary | ICD-10-CM | POA: Diagnosis present

## 2022-01-15 DIAGNOSIS — I7103 Dissection of thoracoabdominal aorta: Secondary | ICD-10-CM | POA: Diagnosis present

## 2022-01-15 DIAGNOSIS — D869 Sarcoidosis, unspecified: Secondary | ICD-10-CM | POA: Diagnosis present

## 2022-01-15 DIAGNOSIS — K4090 Unilateral inguinal hernia, without obstruction or gangrene, not specified as recurrent: Secondary | ICD-10-CM | POA: Diagnosis present

## 2022-01-15 DIAGNOSIS — Z7189 Other specified counseling: Secondary | ICD-10-CM | POA: Diagnosis not present

## 2022-01-15 DIAGNOSIS — I2510 Atherosclerotic heart disease of native coronary artery without angina pectoris: Secondary | ICD-10-CM | POA: Diagnosis present

## 2022-01-15 LAB — HEPATIC FUNCTION PANEL
ALT: 17 U/L (ref 0–44)
AST: 32 U/L (ref 15–41)
Albumin: 4.6 g/dL (ref 3.5–5.0)
Alkaline Phosphatase: 70 U/L (ref 38–126)
Bilirubin, Direct: 0.3 mg/dL — ABNORMAL HIGH (ref 0.0–0.2)
Indirect Bilirubin: 0.6 mg/dL (ref 0.3–0.9)
Total Bilirubin: 0.9 mg/dL (ref 0.3–1.2)
Total Protein: 8.5 g/dL — ABNORMAL HIGH (ref 6.5–8.1)

## 2022-01-15 LAB — MRSA NEXT GEN BY PCR, NASAL: MRSA by PCR Next Gen: NOT DETECTED

## 2022-01-15 LAB — URINE DRUG SCREEN, QUALITATIVE (ARMC ONLY)
Amphetamines, Ur Screen: NOT DETECTED
Barbiturates, Ur Screen: NOT DETECTED
Benzodiazepine, Ur Scrn: NOT DETECTED
Cannabinoid 50 Ng, Ur ~~LOC~~: NOT DETECTED
Cocaine Metabolite,Ur ~~LOC~~: NOT DETECTED
MDMA (Ecstasy)Ur Screen: NOT DETECTED
Methadone Scn, Ur: NOT DETECTED
Opiate, Ur Screen: NOT DETECTED
Phencyclidine (PCP) Ur S: NOT DETECTED
Tricyclic, Ur Screen: NOT DETECTED

## 2022-01-15 LAB — CBC
HCT: 40.2 % (ref 39.0–52.0)
Hemoglobin: 12.6 g/dL — ABNORMAL LOW (ref 13.0–17.0)
MCH: 29.7 pg (ref 26.0–34.0)
MCHC: 31.3 g/dL (ref 30.0–36.0)
MCV: 94.8 fL (ref 80.0–100.0)
Platelets: 321 10*3/uL (ref 150–400)
RBC: 4.24 MIL/uL (ref 4.22–5.81)
RDW: 14.1 % (ref 11.5–15.5)
WBC: 7.1 10*3/uL (ref 4.0–10.5)
nRBC: 0 % (ref 0.0–0.2)

## 2022-01-15 LAB — BASIC METABOLIC PANEL
Anion gap: 12 (ref 5–15)
BUN: 23 mg/dL (ref 8–23)
CO2: 25 mmol/L (ref 22–32)
Calcium: 9.2 mg/dL (ref 8.9–10.3)
Chloride: 97 mmol/L — ABNORMAL LOW (ref 98–111)
Creatinine, Ser: 1.17 mg/dL (ref 0.61–1.24)
GFR, Estimated: >60 mL/min (ref >60)
Glucose, Bld: 110 mg/dL — ABNORMAL HIGH (ref 70–99)
Potassium: 4.2 mmol/L (ref 3.5–5.1)
Sodium: 134 mmol/L — ABNORMAL LOW (ref 135–145)

## 2022-01-15 LAB — RESP PANEL BY RT-PCR (FLU A&B, COVID) ARPGX2
Influenza A by PCR: NEGATIVE
Influenza B by PCR: NEGATIVE
SARS Coronavirus 2 by RT PCR: NEGATIVE

## 2022-01-15 LAB — TROPONIN I (HIGH SENSITIVITY)
Troponin I (High Sensitivity): 4 ng/L (ref <18)
Troponin I (High Sensitivity): 3 ng/L (ref <18)

## 2022-01-15 LAB — LIPASE, BLOOD: Lipase: 42 U/L (ref 11–51)

## 2022-01-15 LAB — PROTIME-INR
INR: 2 — ABNORMAL HIGH (ref 0.8–1.2)
Prothrombin Time: 22.5 seconds — ABNORMAL HIGH (ref 11.4–15.2)

## 2022-01-15 LAB — APTT: aPTT: 58 seconds — ABNORMAL HIGH (ref 24–36)

## 2022-01-15 MED ORDER — SODIUM CHLORIDE 0.9% FLUSH
3.0000 mL | INTRAVENOUS | Status: DC | PRN
Start: 2022-01-15 — End: 2022-01-16

## 2022-01-15 MED ORDER — ALUM & MAG HYDROXIDE-SIMETH 200-200-20 MG/5ML PO SUSP
30.0000 mL | Freq: Once | ORAL | Status: DC
Start: 2022-01-15 — End: 2022-01-15

## 2022-01-15 MED ORDER — FENTANYL CITRATE PF 50 MCG/ML IJ SOSY
50.0000 ug | PREFILLED_SYRINGE | INTRAMUSCULAR | Status: DC | PRN
  Administered 2022-01-16 (×2): 50 ug via INTRAVENOUS
  Filled 2022-01-15 (×2): qty 1

## 2022-01-15 MED ORDER — ESMOLOL HCL-SODIUM CHLORIDE 2000 MG/100ML IV SOLN
25.0000 ug/kg/min | INTRAVENOUS | Status: DC
  Administered 2022-01-15: 175 ug/kg/min via INTRAVENOUS
  Administered 2022-01-15: 25 ug/kg/min via INTRAVENOUS
  Administered 2022-01-16 (×3): 300 ug/kg/min via INTRAVENOUS
  Administered 2022-01-16: 150 ug/kg/min via INTRAVENOUS
  Administered 2022-01-16: 250 ug/kg/min via INTRAVENOUS
  Administered 2022-01-16: 275 ug/kg/min via INTRAVENOUS
  Administered 2022-01-16: 175 ug/kg/min via INTRAVENOUS
  Filled 2022-01-15 (×6): qty 100
  Filled 2022-01-15: qty 200
  Filled 2022-01-15 (×5): qty 100

## 2022-01-15 MED ORDER — MORPHINE SULFATE (PF) 4 MG/ML IV SOLN
4.0000 mg | Freq: Once | INTRAVENOUS | Status: AC
  Administered 2022-01-15: 4 mg via INTRAVENOUS
  Filled 2022-01-15: qty 1

## 2022-01-15 MED ORDER — ONDANSETRON HCL 4 MG/2ML IJ SOLN
4.0000 mg | Freq: Four times a day (QID) | INTRAMUSCULAR | Status: DC | PRN
  Administered 2022-01-15 – 2022-01-16 (×2): 4 mg via INTRAVENOUS
  Filled 2022-01-15 (×2): qty 2

## 2022-01-15 MED ORDER — SODIUM CHLORIDE 0.9 % IV SOLN: 250.0000 mL | INTRAVENOUS | Status: DC | PRN

## 2022-01-15 MED ORDER — OXYCODONE HCL 5 MG PO TABS
5.0000 mg | ORAL_TABLET | ORAL | Status: DC | PRN
Start: 2022-01-15 — End: 2022-01-16
  Administered 2022-01-15 – 2022-01-16 (×3): 5 mg via ORAL
  Filled 2022-01-15 (×3): qty 1

## 2022-01-15 MED ORDER — FENTANYL CITRATE PF 50 MCG/ML IJ SOSY
50.0000 ug | PREFILLED_SYRINGE | Freq: Once | INTRAMUSCULAR | Status: AC
  Administered 2022-01-15: 50 ug via INTRAVENOUS
  Filled 2022-01-15: qty 1

## 2022-01-15 MED ORDER — LABETALOL HCL 5 MG/ML IV SOLN
20.0000 mg | Freq: Once | INTRAVENOUS | Status: AC
  Administered 2022-01-15: 20 mg via INTRAVENOUS
  Filled 2022-01-15: qty 4

## 2022-01-15 MED ORDER — ONDANSETRON HCL 4 MG/2ML IJ SOLN: 4.0000 mg | Freq: Once | INTRAMUSCULAR | Status: AC

## 2022-01-15 MED ORDER — ONDANSETRON HCL 4 MG/2ML IJ SOLN
INTRAMUSCULAR | Status: AC
  Administered 2022-01-15: 4 mg via INTRAVENOUS
  Filled 2022-01-15: qty 2

## 2022-01-15 MED ORDER — MORPHINE SULFATE (PF) 4 MG/ML IV SOLN: 4.0000 mg | Freq: Once | INTRAVENOUS | Status: DC

## 2022-01-15 MED ORDER — CHLORHEXIDINE GLUCONATE CLOTH 2 % EX PADS
6.0000 | MEDICATED_PAD | Freq: Every day | CUTANEOUS | Status: DC
  Administered 2022-01-15 – 2022-01-16 (×2): 6 via TOPICAL

## 2022-01-15 MED ORDER — NICARDIPINE HCL IN NACL 20-0.86 MG/200ML-% IV SOLN
3.0000 mg/h | INTRAVENOUS | Status: DC
  Administered 2022-01-15: 5 mg/h via INTRAVENOUS
  Filled 2022-01-15 (×2): qty 200

## 2022-01-15 MED ORDER — FENTANYL CITRATE PF 50 MCG/ML IJ SOSY
25.0000 ug | PREFILLED_SYRINGE | INTRAMUSCULAR | Status: DC | PRN
  Administered 2022-01-15: 25 ug via INTRAVENOUS
  Filled 2022-01-15: qty 1

## 2022-01-15 MED ORDER — SODIUM CHLORIDE 0.9% FLUSH
3.0000 mL | Freq: Two times a day (BID) | INTRAVENOUS | Status: DC
  Administered 2022-01-15 – 2022-01-16 (×2): 3 mL via INTRAVENOUS

## 2022-01-15 MED ORDER — FAMOTIDINE 20 MG PO TABS: 40.0000 mg | ORAL_TABLET | Freq: Once | ORAL | Status: DC

## 2022-01-15 MED ORDER — PANTOPRAZOLE SODIUM 40 MG IV SOLR: 40.0000 mg | Freq: Once | INTRAVENOUS | Status: DC

## 2022-01-15 MED ORDER — IOHEXOL 350 MG/ML SOLN
100.0000 mL | Freq: Once | INTRAVENOUS | Status: AC | PRN
  Administered 2022-01-15: 100 mL via INTRAVENOUS

## 2022-01-15 NOTE — Progress Notes (Signed)
?  01/15/2022 ?Re: [name] ? ? ? ?   To Whom It May Concern, ? ?Mr. Juan Herrera has been admitted to the ICU at [facility]  in critical condition. Ms. Juan Herrera is the primary care giver for Mr. Juan Herrera will need to be at bedside to support the patient. Length of time is TBD.  ? ?Please Call with any questions.  ? ?[phone] ? ?Corrin Parker, M.D.  ?[facility]  ?Medical Director [place] ?Medical Director [facility] Cardio-Pulmonary Department  ? ? ? ?

## 2022-01-15 NOTE — ED Notes (Signed)
Called [facility] for transfer spoke w/Crystal ?

## 2022-01-15 NOTE — ED Triage Notes (Addendum)
Pt comes into the ED via POV c/o central chest pain that radiates into his back.  Pt sent over by the heart clinic to be evaluated.  Pt has significant cardiac history with a dissection in 2003.  Pt presents with N/V as well.  Pt states the pain started today.  Pt diaphoretic and appears to be uncomfortable in triage at this time. Pt states that he came off his coumadin on Sunday due to having surgery next week.  Pt states that he has had chest and back pain since he came off his medication. Pt states when he came out of his heart clinic today, he felt a sharp "pop" in his chest.  ?

## 2022-01-15 NOTE — Pre-Procedure Instructions (Addendum)
Called over to [place] at Dr Geoffery Lyons office bc she had called wondering if pt really needed cardiac clearance. I told her that Dr Pernell Dupre was the one that mentioned he wanted medical OR cardiac clearance and [name] at Dr Geoffery Lyons office had told me on 4-17 when clearance was requested that since all of his records are at the [place], it would be easier for them to set him up with cardio at [facility]. Purnell Shoemaker said today(4-19) cardiac clearance has not been set up yet bc pt is gettting spinal and not [place] so she was thinking he may not need clearance. I gave her Dr Karlton Lemon ascom # who is on for anesthesia today so that Dr Tonna Boehringer can discuss this with him if needed. Purnell Shoemaker verbalized understanding ?

## 2022-01-15 NOTE — ED Provider Notes (Signed)
? ?[facility] ?Provider Note ? ? ? Event Date/Time  ? First MD Initiated Contact with Patient 01/15/22 1235   ?  (approximate) ? ? ?History  ? ?Chest Pain ? ? ?HPI ? ?Juan Herrera is a 66 y.o. male with a history of COPD, atrial fibrillation, type a aortic dissection with repair and aortic valve replacement 20 years ago, on Coumadin who reports having some intermittent chest pain over the last few days which would resolve with Tylenol.  This morning, he was leaving clinic when he had a sudden pain in his chest radiating to his back which felt like a "lightening bolt."  It is constant and severe.  Denies dizziness but he did have sweating and vomiting. ?  ? ? ?Physical Exam  ? ?Triage Vital Signs: ?ED Triage Vitals  ?Enc Vitals Group  ?   BP 01/15/22 1202 (!) 145/110  ?   Pulse Rate 01/15/22 1202 79  ?   Resp 01/15/22 1202 18  ?   Temp 01/15/22 1202 97.8 ?F (36.6 ?C)  ?   Temp Source 01/15/22 1202 Oral  ?   SpO2 01/15/22 1202 98 %  ?   Weight 01/15/22 1201 154 lb 15.7 oz (70.3 kg)  ?   Height 01/15/22 1201 5\' 7"  (1.702 m)  ?   Head Circumference --   ?   Peak Flow --   ?   Pain Score 01/15/22 1200 8  ?   Pain Loc --   ?   Pain Edu? --   ?   Excl. in GC? --   ? ? ?Most recent vital signs: ?Vitals:  ? 01/15/22 1420 01/15/22 1425  ?BP: (!) 115/98 108/70  ?Pulse: 87 87  ?Resp: (!) 21 (!) 22  ?Temp:    ?SpO2: 100% 100%  ? ? ? ?General: Awake, no distress.  ?CV:  Good peripheral perfusion.  Symmetric radial pulses.  Diminished right DP pulse ?Resp:  Normal effort.  Auscultation bilaterally ?Abd:  No distention.  Soft and nontender ?Other:  Moist oral mucosa ? ? ?ED Results / Procedures / Treatments  ? ?Labs ?(all labs ordered are listed, but only abnormal results are displayed) ?Labs Reviewed  ?BASIC METABOLIC PANEL - Abnormal; Notable for the following components:  ?    Result Value  ? Sodium 134 (*)   ? Chloride 97 (*)   ? Glucose, Bld 110 (*)   ? All other components within normal limits   ?CBC - Abnormal; Notable for the following components:  ? Hemoglobin 12.6 (*)   ? All other components within normal limits  ?PROTIME-INR - Abnormal; Notable for the following components:  ? Prothrombin Time 22.5 (*)   ? INR 2.0 (*)   ? All other components within normal limits  ?APTT - Abnormal; Notable for the following components:  ? aPTT 58 (*)   ? All other components within normal limits  ?HEPATIC FUNCTION PANEL - Abnormal; Notable for the following components:  ? Total Protein 8.5 (*)   ? Bilirubin, Direct 0.3 (*)   ? All other components within normal limits  ?RESP PANEL BY RT-PCR (FLU A&B, COVID) [id number]  ?LIPASE, BLOOD  ?TROPONIN I (HIGH SENSITIVITY)  ?TROPONIN I (HIGH SENSITIVITY)  ? ? ? ?EKG ? ?Interpreted by me ?Normal sinus rhythm rate of 75.  Normal axis and intervals.  Normal QRS ST segments and T waves. ? ? ?RADIOLOGY ?Chest x-ray viewed and interpreted by me, shows very wide irregularly contoured mediastinum concerning  for acute aortic injury. ? ?CT angiogram chest abdomen pelvis shows aortic dissection from a ascending aorta extending through the arch and all the way to the aortic bifurcation.  There is additionally a large aortic arch aneurysm with pseudoaneurysm.  Discussed with radiologist who confirms finding of a contained rupture of the arch aneurysm. ? ? ?PROCEDURES: ? ?Critical Care performed: Yes, see critical care procedure note(s) ? ?.Critical Care ?Performed by: Sharman Cheek, MD ?Authorized by: Sharman Cheek, MD  ? ?Critical care provider statement:  ?  Critical care time (minutes):  60 ?  Critical care time was exclusive of:  Separately billable procedures and treating other patients ?  Critical care was necessary to treat or prevent imminent or life-threatening deterioration of the following conditions:  Cardiac failure and circulatory failure ?  Critical care was time spent personally by me on the following activities:  Development of treatment plan with patient or  surrogate, discussions with consultants, evaluation of patient's response to treatment, examination of patient, obtaining history from patient or surrogate, ordering and performing treatments and interventions, ordering and review of laboratory studies, ordering and review of radiographic studies, pulse oximetry, re-evaluation of patient's condition and review of old charts ? ? ?MEDICATIONS ORDERED IN ED: ?Medications  ?nicardipine (CARDENE) 20mg  in 0.86% saline IV infusion (0.1 mg/ml) (7.5 mg/hr Intravenous Rate/Dose Change 01/15/22 1416)  ?ondansetron (ZOFRAN) injection 4 mg (has no administration in time range)  ?iohexol (OMNIPAQUE) 350 MG/ML injection 100 mL (100 mLs Intravenous Contrast Given 01/15/22 1307)  ?labetalol (NORMODYNE) injection 20 mg (20 mg Intravenous Given 01/15/22 1332)  ?fentaNYL (SUBLIMAZE) injection 50 mcg (50 mcg Intravenous Given 01/15/22 1358)  ? ? ? ?IMPRESSION / MDM / ASSESSMENT AND PLAN / ED COURSE  ?I reviewed the triage vital signs and the nursing notes. ?             ?               ? ?Differential diagnosis includes, but is not limited to, pneumothorax, non-STEMI, aortic dissection, aortic aneurysm, GERD ? ?Patient presents with sudden, severe chest pain radiating to the back with a history of aortic dissection.  Chest x-ray is abnormal.  CT angiogram ordered along with lab panel. ? ? ?Clinical Course as of 01/15/22 1455  ?Wed Jan 15, 2022  ?1331 CT angiogram reviewed and interpreted by me, shows extensive aortic dissection from the ascending aorta all the way down to the bifurcation.  Major branches appear to be opacifying with contrast.  There is aneurysmal dilatation of the aortic arch. [PS]  ?1336 D/w [facility], who will review imaging with MD [PS]  ?1339 Discussed with radiology who confirms finding of dissection as well as ruptured aortic aneurysm with contained hemorrhage. [PS]  ?1406 Advised by [facility] CT surgery team (with Dr,. Bartle) who recommends transfer to  [facility] for CT surg definitive management due to complexity of case. [facility] transfer center contacted. D/w rep. Crystal and [employer] who will contact CT surg team.  ? ? [PS]  ?1427 Discussed with Dr. 1428 of [facility] cardiothoracic surgery.  He advises the patient is not a surgical candidate due to very poor lung function.  He evaluated this patient for aortic aneurysm 2 years ago and determined at that time he was not a surgical candidate.  He advises that given the current situation, patient should be DNR due to futility if he were to suffer cardiac arrest.  If rupture remains contained he recommends admission for blood  pressure management.  He advises if patient develops free rupture, comfort care measures would be appropriate [PS]  ?1428 Currently blood pressure is normal on nicardipine infusion, approximately 110/80. [PS]  ?  ?Clinical Course User Index ?[PS] Sharman CheekStafford, Husayn Reim, MD  ? ? ?----------------------------------------- ?2:52 PM on 01/15/2022 ?----------------------------------------- ?Patient informed of conclusions from discussion with multiple specialists and plan to admit him here for aggressive blood pressure control, pain control and transition to oral antihypertensive regimen that maintains strict hemodynamic parameters.  He understands the seriousness of his condition.  I advised him on needing to avoid any strenuous activity in the future, no heavy lifting.  Inpatient team will determine future use of Coumadin. ? ?FINAL CLINICAL IMPRESSION(S) / ED DIAGNOSES  ? ?Final diagnoses:  ?Ruptured aneurysm of aortic arch (HCC)  ?Dissection of thoracoabdominal aorta (HCC)  ? ? ? ?Rx / DC Orders  ? ?ED Discharge Orders   ? ? None  ? ?  ? ? ? ?Note:  This document was prepared using Dragon voice recognition software and may include unintentional dictation errors. ?  ?Sharman CheekStafford, Bolivar Koranda, MD ?01/15/22 1508 ? ?

## 2022-01-15 NOTE — H&P (Signed)
? ?NAME:  Juan Herrera, MRN:  [id number], DOB:  12-Aug-1956, LOS: 0 ?ADMISSION DATE:  01/15/2022, CONSULTATION DATE:  01/15/2022 ?REFERRING MD:  Dr. Scotty Court, CHIEF COMPLAINT:  Chest pain  ? ?Brief Pt Description / Synopsis:  ?66 y.o. 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  Deemed NOT A SURGICAL CANDIDATE by CT Surgery at [facility] and [facility]. Being admitted for strict BP and HR control with Esmolol drip. ? ?History of Present Illness:  ?Juan Herrera is a 66 year old male with a past medical history significant for COPD, atrial fibrillation, type a aortic dissection with repair, aortic valve replacement on Coumadin therapy who presented to [facility] ED on 01/15/2022 due to complaints of chest pain. ? ?Patient reports he has been having intermittent chest pain over the past few days which improved with Tylenol.  This morning he was set to have outpatient surgical work-up for repair of right inguinal hernia.  However when leaving for the clinic he suddenly developed pain in his chest which radiated to his back, which she described as feeling like a lightening bolt.  He described it as constant and severe.  He also reported associated sweating and vomiting. ? ?ED Course: ?Initial Vital Signs: Temperature 97.8 ?F orally, pulse 79, respiratory 18, blood pressure 145/110, SPO2 98% on room air ?Significant Labs: Sodium 134, glucose 110, BUN 23, creatinine 1.17, high-sensitivity troponin 4, hemoglobin 12.6, hematocrit 40.2, prothrombin time 22.5, INR 2.0 ?Imaging ?Chest X-ray>>IMPRESSION: ?Increased prominence of the aneurysmal aortic arch and descending ?thoracic aortic contour, cannot exclude acute aortic syndrome. Chest ?CT angiogram with IV contrast may be considered for further ?evaluation as clinically warranted. Chronic biapical pleural-parenchymal  scarring. No acute pulmonary disease. ?CTA chest/abdomen/pelvis>>IMPRESSION: ?1. Contained rupture of the thoracic aortic arch beyond the origin ?of the LEFT subclavian at the junction with descending thoracic ?aorta as outlined above large area of added density in hematoma ?based on noncontrast baseline that extends beyond the confines of ?the thoracic aorta. ?2. The aortic dissection extends into the abdominal aorta and LEFT ?common iliac artery. Visceral branches arise from the more anterior ?lumen with multiple fenestrations providing flow to both lumens ?within the abdomen. The dissection is reported as a chronic finding ?on previous imaging studies which are not available for review at ?this time. ?3. Lytic focus in the RIGHT anterior femoral cortex is is suspicious ?but is well circumscribed. Correlate with any history of neoplasm ?and consider follow-up with MRI when the patient is able. ?4. Areas of bronchiectasis and RIGHT upper lobe cavitation in this ?patient with history of sarcoidosis reportedly present on previous ?imaging, comparison with this imaging on follow-up may be helpful to ?determine whether there is progression. ?5. Large fat containing RIGHT inguinal hernia extends into the ?scrotum. ?Medications Administered: 20 mg IV labetalol, 4 mg IV morphine, 50 mcg fentanyl, nicardipine drip ? ?ED provider discussed CT findings of aortic dissection and ruptured aortic aneurysm with contained hemorrhage with CT surgery at both [facility] and [facility], who both deemed that patient was not a surgical candidate.  They recommended patient be admitted for strict blood pressure management and pain control. ? ?PCCM is asked to admit. ? ? ?Pertinent  Medical History  ? ?Active Ambulatory Problems  ?  Diagnosis Date Noted  ? No Active Ambulatory Problems  ? ?Resolved Ambulatory Problems  ?  Diagnosis Date Noted  ? No Resolved  Ambulatory Problems  ? ?Past Medical History:  ?Diagnosis Date  ? A-fib (HCC)   ? Alcohol  abuse   ? Anemia   ? Anticoagulant long-term use   ? Aortic valve regurgitation due to aortic dissection (HCC)   ? Back pain   ? Bereavement   ? CAD (coronary artery disease)   ? Cocaine dependence in remission [facility])   ? COPD (chronic obstructive pulmonary disease) (HCC)   ? Cough   ? Depression   ? Depressive disorder   ? Flat foot   ? History of aortic valve replacement 2003  ? History of chlamydia   ? HLD (hyperlipidemia)   ? Hypertension   ? Inferior ST segment depression   ? Inguinal hernia   ? Insomnia   ? Major depression   ? Old MI (myocardial infarction)   ? Postoperative seroma of skin after dermatologic procedure   ? PTSD (post-traumatic stress disorder)   ? PTSD (post-traumatic stress disorder)   ? Sarcoidosis   ? Trichomonas infection   ? ? ? ?Micro Data:  ?N/A ? ?Antimicrobials:  ?N/A ? ?Significant Hospital Events: ?Including procedures, antibiotic start and stop dates in addition to other pertinent events   ?4/19: Presents to ED with chest pain.  CT findings discussed with cardiothoracic surgery at [facility] at [facility], patient deemed not a surgical candidate.  PCCM asked to admit at [facility] for strict blood pressure and heart rate management with esmolol drip, along with pain control ? ?Interim History / Subjective:  ?-Afebrile, hemodynamically stable, on room air ?-Patient seen in the ED, currently on nicardipine drip with SBP in the low 100s ?-Currently denies chest pain ?-Discussed with him that he is not a surgical candidate, being admitted for strict blood pressure and heart rate control ?-Patient is very upset and adamant that he wants to have his right hernia repair ? ?Objective   ?Blood pressure 97/62, pulse 73, temperature 97.8 ?F (36.6 ?C), temperature source Oral, resp. rate 15, height 5\' 7"  (1.702 m), weight 70.3 kg, SpO2 100 %. ?   ?   ?No intake or output data in the 24 hours ending 01/15/22 1719 ?Filed Weights  ? 01/15/22 1201  ?Weight: 70.3 kg  ? ? ?Examination: ?General: Acutely  ill-appearing male, sitting in bed, on room air, no acute distress ?HENT: Atraumatic, normocephalic, neck supple, no JVD, moist mucous membranes ?Lungs: Clear breath sounds to auscultation bilaterally, no wheezing or rales noted, even, nonlabored ?Cardiovascular: Regular rate and rhythm, S1-S2, no murmurs, rubs, gallops ?Abdomen: Soft, nontender, nondistended, no guarding or rebound tenderness, bowel sounds positive x4 ?Extremities: Normal bulk and tone, no deformities, no cyanosis or clubbing ?Neuro: Awake and alert, oriented x4, moves all extremities to command, no focal deficits, speech clear, pupils PERRLA ?GU: Deferred ? ?Resolved Hospital Problem list   ? ? ?Assessment & Plan:  ? 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 01/17/22 with Vascular Surgery here at [facility], she is in agreement that no intervention can be performed, and is in agreement with BP & HR control with infusions as outlined above  ?-Also discussed with Dr. [facility] regarding whether to reverse Coumadin due  to intramural hematoma of distal thoracic aortic arch ~ Dr. Evie LacksEsco recommends letting INR be and letting it come down on its on given aortic valve replacement ? ?Right inguinal hernia ?-Unfortunately Not a surgical candidate currently due to seriousness of AAA and dissection ? ? ? ? ? ? ?Pt's abdominal aneurysm is not amendable to surgical intervention per CT Surgery.  Despite medical management, could rupture at any time.  Recommend DNR/DNI status as if it were to rupture, any resuscitative efforts would be futile. ? ?Best Practice (right click and "Reselect  all SmartList Selections" daily)  ? ?Diet/type: Regular consistency (see orders) ?DVT prophylaxis: SCD ?GI prophylaxis: N/A ?Lines: N/A ?Foley:  N/A ?Code Status:  full code ?Last date of multidisciplinary goals of

## 2022-01-15 NOTE — Progress Notes (Signed)
Copied from [place] coumadin clinic for reference re: lovenox bridge ? ?Plan: ?- Last warfarin dose, 4/15 ?- No warfarin 4/16 - 4/21 ?- Restart per Surgical team pending hemostasis while inpatient (likely 4/22) ?- When restarting, start enoxaparin injection 40mg  every 24hrs until the next  ?INR check (4/28). Also restart warfarin at the below dose. You will note  ?'booster doses' for the first 2 days in an effort to reach INR goal more quickly  ?and limit the use of enoxaparin injections. ?- 4/22 warfarin: **10mg  dose** ?- 4/23 warfarin: **10mg  dose** ?- 4/24 warfarin resume regular dose 7.5mg  daily except 5mg  Monday and ?Thursday ?- Follow up INR 1 week post-op, 01/24/22 ?

## 2022-01-16 ENCOUNTER — Other Ambulatory Visit: Payer: Self-pay | Admitting: Critical Care Medicine

## 2022-01-16 ENCOUNTER — Encounter: Payer: Self-pay | Admitting: Internal Medicine

## 2022-01-16 DIAGNOSIS — Z7189 Other specified counseling: Secondary | ICD-10-CM

## 2022-01-16 DIAGNOSIS — Z515 Encounter for palliative care: Secondary | ICD-10-CM

## 2022-01-16 DIAGNOSIS — I7100 Dissection of unspecified site of aorta: Secondary | ICD-10-CM

## 2022-01-16 LAB — BASIC METABOLIC PANEL
Anion gap: 8 (ref 5–15)
BUN: 29 mg/dL — ABNORMAL HIGH (ref 8–23)
CO2: 29 mmol/L (ref 22–32)
Calcium: 8.5 mg/dL — ABNORMAL LOW (ref 8.9–10.3)
Chloride: 93 mmol/L — ABNORMAL LOW (ref 98–111)
Creatinine, Ser: 1.19 mg/dL (ref 0.61–1.24)
GFR, Estimated: >60 mL/min (ref >60)
Glucose, Bld: 117 mg/dL — ABNORMAL HIGH (ref 70–99)
Potassium: 5 mmol/L (ref 3.5–5.1)
Sodium: 130 mmol/L — ABNORMAL LOW (ref 135–145)

## 2022-01-16 LAB — CBC
HCT: 36.6 % — ABNORMAL LOW (ref 39.0–52.0)
Hemoglobin: 11.5 g/dL — ABNORMAL LOW (ref 13.0–17.0)
MCH: 29.6 pg (ref 26.0–34.0)
MCHC: 31.4 g/dL (ref 30.0–36.0)
MCV: 94.3 fL (ref 80.0–100.0)
Platelets: 305 10*3/uL (ref 150–400)
RBC: 3.88 MIL/uL — ABNORMAL LOW (ref 4.22–5.81)
RDW: 14.3 % (ref 11.5–15.5)
WBC: 8.7 10*3/uL (ref 4.0–10.5)
nRBC: 0 % (ref 0.0–0.2)

## 2022-01-16 LAB — HIV ANTIBODY (ROUTINE TESTING W REFLEX): HIV Screen 4th Generation wRfx: NONREACTIVE

## 2022-01-16 LAB — GLUCOSE, CAPILLARY: Glucose-Capillary: 131 mg/dL — ABNORMAL HIGH (ref 70–99)

## 2022-01-16 MED ORDER — BUDESONIDE 0.5 MG/2ML IN SUSP
0.5000 mg | Freq: Two times a day (BID) | RESPIRATORY_TRACT | Status: DC
  Administered 2022-01-16: 0.5 mg via RESPIRATORY_TRACT
  Filled 2022-01-16: qty 2

## 2022-01-16 MED ORDER — OXYCODONE-ACETAMINOPHEN 5-325 MG PO TABS: 1.0000 | ORAL_TABLET | Freq: Four times a day (QID) | ORAL | 0 refills | Status: DC | PRN

## 2022-01-16 MED ORDER — IPRATROPIUM-ALBUTEROL 0.5-2.5 (3) MG/3ML IN SOLN
3.0000 mL | RESPIRATORY_TRACT | Status: DC | PRN
Start: 2022-01-16 — End: 2022-01-16
  Administered 2022-01-16: 3 mL via RESPIRATORY_TRACT
  Filled 2022-01-16: qty 3

## 2022-01-16 MED ORDER — ONDANSETRON HCL 8 MG PO TABS: 8.0000 mg | ORAL_TABLET | Freq: Three times a day (TID) | ORAL | 0 refills | Status: DC | PRN

## 2022-01-16 MED ORDER — HYDROXYCHLOROQUINE SULFATE 200 MG PO TABS
400.0000 mg | ORAL_TABLET | Freq: Every day | ORAL | Status: DC
  Filled 2022-01-16: qty 2

## 2022-01-16 MED ORDER — SERTRALINE HCL 50 MG PO TABS: 100.0000 mg | ORAL_TABLET | Freq: Every day | ORAL | Status: DC

## 2022-01-16 MED ORDER — OXYCODONE-ACETAMINOPHEN 5-325 MG PO TABS
1.0000 | ORAL_TABLET | Freq: Four times a day (QID) | ORAL | Status: DC | PRN
  Filled 2022-01-16: qty 1

## 2022-01-16 MED ORDER — ENSURE ENLIVE PO LIQD
237.0000 mL | Freq: Three times a day (TID) | ORAL | Status: DC
  Administered 2022-01-16: 237 mL via ORAL

## 2022-01-16 MED ORDER — OXYCODONE HCL 5 MG PO TABS
5.0000 mg | ORAL_TABLET | ORAL | Status: DC | PRN
Start: 2022-01-16 — End: 2022-01-16

## 2022-01-16 MED ORDER — IPRATROPIUM-ALBUTEROL 0.5-2.5 (3) MG/3ML IN SOLN
3.0000 mL | Freq: Four times a day (QID) | RESPIRATORY_TRACT | Status: DC
  Administered 2022-01-16: 3 mL via RESPIRATORY_TRACT
  Filled 2022-01-16: qty 3

## 2022-01-16 MED ORDER — MIRTAZAPINE 15 MG PO TABS: 15.0000 mg | ORAL_TABLET | Freq: Every day | ORAL | Status: DC

## 2022-01-16 MED ORDER — IPRATROPIUM-ALBUTEROL 0.5-2.5 (3) MG/3ML IN SOLN
3.0000 mL | RESPIRATORY_TRACT | 10 refills | Status: DC | PRN
Start: 2022-01-16 — End: 2022-01-18

## 2022-01-16 MED ORDER — IPRATROPIUM-ALBUTEROL 0.5-2.5 (3) MG/3ML IN SOLN: 3.0000 mL | Freq: Four times a day (QID) | RESPIRATORY_TRACT | Status: DC

## 2022-01-16 MED ORDER — IPRATROPIUM-ALBUTEROL 0.5-2.5 (3) MG/3ML IN SOLN
3.0000 mL | Freq: Four times a day (QID) | RESPIRATORY_TRACT | Status: DC | PRN
Start: 2022-01-16 — End: 2022-01-16

## 2022-01-16 MED ORDER — ONDANSETRON HCL 4 MG PO TABS: 8.0000 mg | ORAL_TABLET | Freq: Three times a day (TID) | ORAL | Status: DC | PRN

## 2022-01-16 MED ORDER — SODIUM CHLORIDE 0.9 % IV SOLN
6.2500 mg | Freq: Four times a day (QID) | INTRAVENOUS | Status: DC | PRN
  Filled 2022-01-16: qty 0.25

## 2022-01-16 MED ORDER — BUPROPION HCL ER (XL) 300 MG PO TB24
300.0000 mg | ORAL_TABLET | Freq: Every day | ORAL | Status: DC
  Filled 2022-01-16: qty 1

## 2022-01-16 MED ORDER — ADULT MULTIVITAMIN W/MINERALS CH: 1.0000 | ORAL_TABLET | Freq: Every day | ORAL | Status: DC

## 2022-01-16 NOTE — Progress Notes (Signed)
0800 Patient alert able to make all needs known pt upset that everyone keeps telling him he is not a surgical candidate plan of care reviewed with patient assuring him that we are treating him patient asked how he can go home.  ? ?0900 ICU RN told patient that he needs his HR and BP lower to be safe to go home due to his aortic dissection. ICU RN also discussed going home on comfort care vs coming back to the hospital when symptoms get worse.  ? ?Patient keeps taking off 02 despite getting 02 extension ?Patient girl friend present and patient doesn't want her to know his condition he even stated when he was ready he would talk with her also. Patient girlfriend tearful wanting more information but understanding that we can not discuss patient conditions with her. ? ?1100 PRN pain mediations given  ? ?1400 MULTIPLE CONVERSATIONS WITH PATIENT REGARDING RISKS FOR DEATH AND DYING AT HOME ? ?40 Girl friend here hospice called patient wanting to leave on hospice care NOW ? ? ? ? ? ?PATIENT IS ALERT AND AWAKE AND FOLLOWING COMMANDS ?PATIENT IS NOT AGITATED AND NOT DELIRIOUS. I HAVE EXPLAINED TO TO HIM THAT SAFEST THING TO DO NOW IS TO STAY IN THE ICU, AT THIS TIME, PATIENT ADAMANTLY WANTS TO GO HOME. ? ?I HAVE EXPLAINED TO HIM THAT PATIENT WILL NEED TO GO HOME WITH HOSPICE IF WE CAN ARRANGE THAT OR SIGN AGAINST MEDICAL ADVICE. ? ?PATIENT UNDERSTANDS THAT HE WILL DIE AT HOME AND HE WANTS TO SPEND HIS TIME WITH HIS FAMILY AND CHILDREN. ? ? ? ?PALLIATIVE CARE TEAM TO ASSIST WITH HOSPICE AT HOME ?

## 2022-01-16 NOTE — Consult Note (Signed)
Subjective:  ? ?CC: inguinal hernia ? ?HPI: ? Juan Herrera is a 66 y.o. male who request further discussion of above.  Currently admitted to ICU after suffering aortic dissection yesterday. ? ?Pt has been insisting to other providers he still wishes to undergo hernia repair. ? ?  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 ? ?Past Surgical History:  ?Past Surgical History:  ?Procedure Laterality Date  ? AORTIC VALVE REPLACEMENT    ? HERNIA REPAIR    ? left side 2021  ? ? ?Family History: family history includes Healthy in his father and mother. ? ?Social History:  reports that he has never smoked. He has never used smokeless tobacco. He reports current alcohol use. He reports that he does not use drugs. ? ?Current Medications:  ?Prior to Admission medications   ?Medication Sig Start Date End Date Taking? Authorizing Provider  ?buPROPion (WELLBUTRIN) 100 MG tablet Take by mouth.    [provider]  ?LOVENOX 40 MG/0.4ML injection Inject 40 mg into the skin daily. 01/09/22   [provider]  ?trimethoprim-polymyxin b (POLYTRIM) ophthalmic solution Place 1 drop into both eyes every 4 (four) hours. 12/27/21   Valinda HoarWhite, Adrienne R, NP  ?warfarin (COUMADIN) 5 MG tablet Take 5-7.5 mg by mouth daily.  01/09/22   [provider]  ?warfarin (COUMADIN) 7.5 MG tablet Take 7.5 mg by mouth See admin instructions. 7.5mg  X 5 days a week,  ?5 mg X 2 days a week Tuesday and Thursday ?Patient not taking: Reported on 01/16/2022    [provider]  ? ? ?Allergies:  ?Allergies as of 01/15/2022  ? (No Known Allergies)  ? ? ?ROS:  ?General: Denies weight loss, weight gain, fatigue, fevers, chills, and night sweats. ?Eyes: Denies blurry vision, double vision, eye pain, itchy eyes, and tearing. ?Ears: Denies hearing loss, earache, and ringing in ears. ?Nose: Denies sinus pain, congestion, infections, runny nose, and nosebleeds. ?Mouth/throat: Denies hoarseness, sore throat, bleeding gums, and difficulty swallowing. ?Heart: Denies chest pain, palpitations, racing heart, irregular heartbeat, leg pain or swelling, and decreased activity tolerance. ?Respiratory: Denies breathing difficulty, shortness of breath, wheezing, cough, and sputum. ?GI: Denies change in appetite, heartburn, nausea, vomiting, constipation, diarrhea, and blood in stool. ?GU: Denies difficulty urinating, pain with urinating, urgency, frequency, blood in urine. ?Musculoskeletal: Denies joint stiffness, pain, swelling, muscle weakness. ?Skin: Denies rash, itching, mass, tumors, sores, and boils ?Neurologic: Denies headache, fainting, dizziness, seizures, numbness, and tingling. ?Psychiatric: Denies depression, anxiety, difficulty sleeping, and memory loss. ?Endocrine: Denies heat or cold intolerance, and increased thirst or urination. ?Blood/lymph: Denies easy bruising, easy bruising, and swollen glands ? ? ?  ?Objective:  ?  ? ?BP 124/65   Pulse 85   Temp (!) 97.5 ?F (36.4 ?C) (Oral)   Resp 19   Ht 5\' 7"  (1.702 m)   Wt 67.1 kg   SpO2 95%   BMI 23.17 kg/m?  ? ?Constitutional :  alert and appears stated age  ?Lymphatics/Throat:  no asymmetry,  masses, or scars  ?Respiratory:  clear  ?Cardiovascular:  regular rate and rhythm, S1, S2 normal, no  murmur, click, rub or gallop and regular rate and rhythm  ?Gastrointestinal: soft, non-tender; bowel sounds normal; no masses,  no organomegaly.   ?Musculoskeletal: Sitting in chair no obvious difficulty moving upper extremities  ?Skin: Cool and moist  ?Psychiatric: Normal affect, non-agitated, not confused  ?   ?  ?LABS:  ? ?  Latest Ref Rng & Units 01/16/2022  ?  6:33 AM 01/15/2022  ?  1:42 PM 01/15/2022  ? 12:02 PM  ?CMP  ?Glucose 70 - 99 mg/dL 614    431    ?BUN 8 - 23 mg/dL 29    23    ?Creatinine 0.61 - 1.24 mg/dL 5.40    0.86    ?Sodium 135 - 145 mmol/L 130    134    ?Potassium 3.5 - 5.1 mmol/L 5.0    4.2    ?Chloride 98 - 111 mmol/L 93    97    ?CO2 22 - 32 mmol/L 29    25    ?Calcium 8.9 - 10.3 mg/dL 8.5    9.2    ?Total Protein 6.5 - 8.1 g/dL  8.5     ?Total Bilirubin 0.3 - 1.2 mg/dL  0.9     ?Alkaline Phos 38 - 126 U/L  70     ?AST 15 - 41 U/L  32     ?ALT 0 - 44 U/L  17     ? ? ?  Latest Ref Rng & Units 01/16/2022  ?  6:33 AM 01/15/2022  ? 12:02 PM  ?CBC  ?WBC 4.0 - 10.5 K/uL 8.7   7.1    ?Hemoglobin 13.0 - 17.0 g/dL 76.1   95.0    ?Hematocrit 39.0 - 52.0 % 36.6   40.2    ?Platelets 150 - 400 K/uL 305   321    ? ? ?RADS: ?Narrative & Impression  ?CLINICAL DATA:  Chest pain, back pain with suspected aortic ?dissection in a patient with history of prior repair of the aortic ?root and replacement of the aortic valve and previous aortic ?dissection based on imaging reports dating back to 2021. ?  ?EXAM: ?CT ANGIOGRAPHY CHEST, ABDOMEN AND PELVIS ?  ?TECHNIQUE: ?Non-contrast CT of the chest was initially obtained. ?  ?Multidetector CT imaging through the chest, abdomen and pelvis was ?performed using the standard protocol during bolus administration of ?intravenous contrast. Multiplanar reconstructed images and MIPs were ?obtained and reviewed to evaluate the vascular anatomy. ?  ?RADIATION DOSE REDUCTION: This exam was performed according to the ?departmental dose-optimization program which includes  automated ?exposure control, adjustment of the mA and/or kV according to ?patient size and/or use of iterative reconstruction technique. ?  ?CONTRAST:  OMNIPAQUE IOHEXOL 350 MG/ML SOLN ?  ?COMPARISON:  None ?  ?FINDINGS: ?CTA CHEST FINDINGS ?  ?Cardiovascular: Signs of prior aortic root repair and aortic valve ?replacement. Intramural hematoma seen on noncontrast imaging in the ?distal thoracic aortic arch with focal outpouching extending above ?the arch with the defect in the wall of the aortic arch measuring 18 ?mm. In the sagittal plane this breech measuring 24 mm. Hematoma and ?contrast is contained to an area that extends into the LEFT upper ?chest above the thoracic aortic arch approximately 3.5 cm beyond the ?origin of the LEFT subclavian artery. This area measures ?approximately 5.5 x 3.2 x 6.8 cm. ?  ?Signs of prior dissection exhibited on  contrasted imaging with ?fenestration extending into the abdominal aorta and LEFT iliac ?artery. ?  ?Maximal caliber of the distal arch up to 6.6 cm (image 40/10) ?  ?4.7 cm caliber of the ascending thoracic aorta above the root repair ?and valvular replacement is similar to reported caliber on previous ?imaging as is maximal caliber of the distal arch/descending thoracic ?aorta, just beyond the area of aortic rupture. ?  ?Great vessels in the chest despite extension of the fenestrated ?dissection flap across the origin of the great vessels are well ?opacified with contrast, configuration is similar to that described ?on previous imaging evaluations. ?  ?Heart size is normal. ?  ?Central pulmonary vasculature is unremarkable though study is not ?optimized for pulmonary vascular assessment. ?  ?Mediastinum/Nodes: Stranding about the LEFT mediastinal border in ?the setting of contained rupture of the thoracic aorta no adenopathy ?in the chest. ?  ?Lungs/Pleura: Signs of bronchiectasis and pleural and parenchymal ?scarring particularly on the RIGHT in the upper lobe  with an area of ?cavitation in the RIGHT upper lobe measuring 4.6 x 3.5 cm. No ?pneumothorax. No current evidence of pleural effusion. Grouped ?nodules in the RIGHT lung base measuring 3-4 mm (image 92/11) ?atelectasis at t

## 2022-01-16 NOTE — Progress Notes (Signed)
Civil engineer, contracting [facility]) Hospital Liaison Note ? ?Received request from PMT/Tasha D., NP for family interest in Hospice. Patient is adamant for DC today per IDT and [facility] staff is aware and will f/u upon patient discharge. ? ?Please do not hesitate to call with any hospice related questions.  ?  ?Thank you for the opportunity to participate in this patient's care. ? ?Odette Fraction, MSW ?[facility] Liaison  ?[phone] ? ?

## 2022-01-16 NOTE — TOC Transition Note (Signed)
Transition of Care (TOC) - CM/SW Discharge Note ? ? ?Patient Details  ?Name: Juan Herrera ?MRN: [id number] ?Date of Birth: 1955/10/16 ? ?Transition of Care (TOC) CM/SW Contact:  ?Shelbie Hutching, RN ?Phone Number: ?01/16/2022, 4:38 PM ? ? ?Clinical Narrative:    ?Palliative met with patient and the family and they have decided on hospice at home.  [facility] given referral by palliative NP.  [place] where patient lives and they should be able to admit tomorrow.   ? ?No TOC needs. ? ? ?Final next level of care: [place] ?Barriers to Discharge: Barriers Resolved ? ? ?Patient Goals and CMS Choice ?Patient states their goals for this hospitalization and ongoing recovery are:: Patient wants to go home tonight with hospice care ?CMS Medicare.gov Compare Post Acute Care list provided to:: Patient ?Choice offered to / list presented to : Patient ? ?Discharge Placement ?  ?           ?  ?  ?  ?  ? ?Discharge Plan and Services ?  ?  ?           ?DME Arranged: N/A ?  ?  ?  ?  ?HH Arranged: NA ?  ?  ?  ?  ? ?Social Determinants of Health (SDOH) Interventions ?  ? ? ?Readmission Risk Interventions ?   ? View : No data to display.  ?  ?  ?  ? ? ? ? ? ?

## 2022-01-16 NOTE — Discharge Summary (Signed)
Physician Discharge Summary  ?Patient ID: ?Juan Herrera ?MRN: [id number] ?DOB/AGE: [birth year] 64 y.o. ? ?Admit date: 01/15/2022 ?Discharge date: 01/16/2022 ? 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 ?Discharged Condition: critical ? ?Hospital Course:  ?66 y.o. 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  Deemed NOT A SURGICAL CANDIDATE by CT Surgery at [facility] and [facility]. Being admitted for strict BP and HR control with Esmolol drip. ?  ?History of Present Illness:  ?Juan Herrera is a 66 year old male with a past medical history significant for COPD, atrial fibrillation, type a aortic dissection with repair, aortic valve replacement on Coumadin therapy who presented to [facility] ED on 01/15/2022 due to complaints of chest pain. ?  ?Patient reports he has been having intermittent chest pain over the past few days which improved with Tylenol.  This morning he was set to have outpatient surgical work-up for repair of right inguinal hernia.  However when leaving for the clinic he suddenly developed pain in his chest which radiated to his back, which she described as feeling like a lightening bolt.  He described it as constant and severe.  He also reported associated sweating and vomiting. ?  ?ED Course: ?Initial Vital Signs: Temperature 97.8 ?F orally, pulse 79, respiratory 18, blood pressure 145/110, SPO2 98% on room air ?Significant Labs: Sodium 134, glucose 110, BUN 23, creatinine 1.17, high-sensitivity troponin 4, hemoglobin 12.6, hematocrit 40.2, prothrombin time 22.5, INR 2.0 ?Imaging ?Chest X-ray>> Increased  prominence of the aneurysmal aortic arch and descending ?thoracic aortic contour, cannot exclude acute aortic syndrome. Chest CT angiogram with IV contrast may be considered for further evaluation as clinically warranted. Chronic biapical pleural-parenchymal scarring. No acute pulmonary disease. ?CTA chest/abdomen/pelvis>>Contained rupture of the thoracic aortic arch beyond the origin of the LEFT subclavian at the junction with descending thoracic ?aorta as outlined above large area of added density in hematoma based on noncontrast baseline that extends beyond the confines of the thoracic aorta. The aortic dissection extends into the abdominal aorta and LEFT common iliac artery. Visceral branches arise from the more anterior lumen with multiple fenestrations providing flow to both lumens within the abdomen. The dissection is reported as a chronic finding on previous imaging studies which are not available for review at ?this time. Lytic focus in the RIGHT anterior femoral cortex is is suspicious but is well circumscribed. Correlate with any history of neoplasm and consider follow-up with MRI when the patient is able. Areas of bronchiectasis and RIGHT upper lobe cavitation in this patient with history of sarcoidosis reportedly present on previous ?imaging, comparison with this imaging on follow-up may be helpful to determine whether there is progression. Large fat containing RIGHT inguinal hernia extends into the scrotum. ?Medications Administered: 20 mg IV labetalol, 4 mg IV morphine, 50 mcg fentanyl, nicardipine drip ?  ?ED provider discussed CT findings of aortic dissection and ruptured aortic aneurysm with contained hemorrhage with CT surgery at both [facility] and [facility], who both deemed that patient was not a surgical candidate.  They recommended patient be admitted for strict blood pressure management and pain control. ? ? ?  ?   ?Clinical Course as of 01/15/22 1455  ?  Wed Jan 15, 2022  ?1331 CT angiogram  reviewed and interpreted by me, shows extensive aortic dissection from the ascending aorta all the way down to the bifurcation.  Major branches appear to be opacifying with contrast.  There is aneurysmal dilatation of the aortic arch. [PS]  ?1336 D/w [facility], who will review imaging with MD [PS]  ?1339 Discussed with radiology who confirms finding of dissection as well as ruptured aortic aneurysm with contained hemorrhage. [PS]  ?1406 Advised by [facility] team (with Dr,. Bartle) who [facility] surg definitive management due to complexity of case. [facility] transfer center contacted. D/w rep. [facility] and [employer] who will contact CT surg team.  ?  ? [PS]  ?1427 Discussed with Dr. Kizzie BaneHughes of [facility] cardiothoracic surgery.  He advises the patient is not a surgical candidate due to very poor lung function.  He evaluated this patient for aortic aneurysm 2 years ago and determined at that time he was not a surgical candidate.  He advises that given the current situation, patient should be DNR due to futility if he were to suffer cardiac arrest.  If rupture remains contained he recommends admission for blood pressure management.  He advises if patient develops free rupture, comfort care measures would be appropriate [PS]  ?1428 Currently blood pressure is normal on nicardipine infusion, approximately 110/80. [PS]  ?   ? ? ?  ?PCCM is asked to admit. ?  ?Pertinent  Medical History  ?  ?    ?Active Ambulatory Problems  ?  Diagnosis Date Noted  ? No Active Ambulatory Problems  ?  ?    ?Resolved Ambulatory Problems  ?  Diagnosis Date Noted  ? No Resolved Ambulatory Problems  ?  ?    ?Past Medical History:  ?Diagnosis Date  ? A-fib (HCC)    ? Alcohol abuse    ? Anemia    ? Anticoagulant long-term use    ? Aortic valve regurgitation due to aortic dissection (HCC)    ? Back pain    ? Bereavement    ? CAD (coronary artery disease)    ? Cocaine dependence in remission [facility](HCC)    ? COPD (chronic  obstructive pulmonary disease) (HCC)    ? Cough    ? Depression    ? Depressive disorder    ? Flat foot    ? History of aortic valve replacement 2003  ? History of chlamydia    ? HLD (hyperlipidemia)    ? Hypertension    ? Inferior ST segment depression    ? Inguinal hernia    ? Insomnia    ? Major depression    ? Old MI (myocardial infarction)    ? Postoperative seroma of skin after dermatologic procedure    ? PTSD (post-traumatic stress disorder)    ? PTSD (post-traumatic stress disorder)    ? Sarcoidosis    ? Trichomonas infection    ?  ?Micro Data:  ?N/A ?  ?Antimicrobials:  ?N/A ?  ?Significant Hospital Events: ?Including procedures, antibiotic start and stop dates in addition to other pertinent events   ?4/19: Presents to ED with chest pain.  [facility], patient deemed not a surgical candidate.  PCCM asked to admit at [facility] for strict blood pressure and heart rate management with esmolol drip, along with pain control ?4/20: Pt with nausea/dry heaving and shortness of breath.  Requiring 3L O2  via nasal canula due to pt becoming hypoxic on RA (O2 sats decrease to the 70's)  Requiring 250 mcg/kg/min to maintain sbp 100-120 ?  ?Interim History / Subjective:  ?Pt requiring 3L O2 via nasal canula with shortness of breath at rest worse with exertion.  Overnight and this morning developed nausea with periods of dry heaving.  ?  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 Evie Lacks with Vascular Surgery here at [facility], she  is in agreement that no intervention can be performed, and is in agreement with BP & HR control with infusions as outlined above  ?-Case discussed with Dr. Evie Lacks regarding whether to reverse Coumadin due to intramu

## 2022-01-16 NOTE — Progress Notes (Signed)
?   01/16/22 1105  ?Clinical Encounter Type  ?Visited With Patient and family together  ?Visit Type Initial;Spiritual support;Social support  ?Referral From Family  ?Consult/Referral To Chaplain  ? ?Chaplain Burris responded to request from family to assist with HCPOA. Chaplain B engaged Pt and partner to assess their needs. Chaplain returned with document and discussed the options for completing document; also assessed Pt's ability to authorize. ? ?Will attempt to return this afternoon to notarize. ?

## 2022-01-16 NOTE — Progress Notes (Signed)
? ?NAME:  Juan Herrera, MRN:  [id number], DOB:  1956-02-17, LOS: 1 ?ADMISSION DATE:  01/15/2022, CONSULTATION DATE:  01/15/2022 ?REFERRING MD:  Dr. Scotty CourtStafford, CHIEF COMPLAINT:  Chest pain  ? ?Brief Pt Description / Synopsis:  ?[age] y.o. 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  Deemed NOT A SURGICAL CANDIDATE by CT Surgery at [facility] and [facility]. Being admitted for strict BP and HR control with Esmolol drip. ? ?History of Present Illness:  ?Juan Herrera is a 66 year old male with a past medical history significant for COPD, atrial fibrillation, type a aortic dissection with repair, aortic valve replacement on Coumadin therapy who presented to [facility] ED on 01/15/2022 due to complaints of chest pain. ? ?Patient reports he has been having intermittent chest pain over the past few days which improved with Tylenol.  This morning he was set to have outpatient surgical work-up for repair of right inguinal hernia.  However when leaving for the clinic he suddenly developed pain in his chest which radiated to his back, which she described as feeling like a lightening bolt.  He described it as constant and severe.  He also reported associated sweating and vomiting. ? ?ED Course: ?Initial Vital Signs: Temperature 97.8 ?F orally, pulse 79, respiratory 18, blood pressure 145/110, SPO2 98% on room air ?Significant Labs: Sodium 134, glucose 110, BUN 23, creatinine 1.17, high-sensitivity troponin 4, hemoglobin 12.6, hematocrit 40.2, prothrombin time 22.5, INR 2.0 ?Imaging ?Chest X-ray>> Increased prominence of the aneurysmal aortic arch and descending ?thoracic aortic contour, cannot exclude acute aortic syndrome. Chest CT angiogram with IV contrast may be considered for further evaluation as clinically warranted. Chronic biapical pleural-parenchymal scarring. No acute  pulmonary disease. ?CTA chest/abdomen/pelvis>>Contained rupture of the thoracic aortic arch beyond the origin of the LEFT subclavian at the junction with descending thoracic ?aorta as outlined above large area of added density in hematoma based on noncontrast baseline that extends beyond the confines of the thoracic aorta. The aortic dissection extends into the abdominal aorta and LEFT common iliac artery. Visceral branches arise from the more anterior lumen with multiple fenestrations providing flow to both lumens within the abdomen. The dissection is reported as a chronic finding on previous imaging studies which are not available for review at ?this time. Lytic focus in the RIGHT anterior femoral cortex is is suspicious but is well circumscribed. Correlate with any history of neoplasm and consider follow-up with MRI when the patient is able. Areas of bronchiectasis and RIGHT upper lobe cavitation in this patient with history of sarcoidosis reportedly present on previous ?imaging, comparison with this imaging on follow-up may be helpful to determine whether there is progression. Large fat containing RIGHT inguinal hernia extends into the scrotum. ?Medications Administered: 20 mg IV labetalol, 4 mg IV morphine, 50 mcg fentanyl, nicardipine drip ? ?ED provider discussed CT findings of aortic dissection and ruptured aortic aneurysm with contained hemorrhage with CT surgery at both [facility] and [facility], who both deemed that patient was not a surgical candidate.  They recommended patient be admitted for strict blood pressure management and pain control. ? ?PCCM is asked to admit. ? ?Pertinent  Medical History  ? ?Active Ambulatory Problems  ?  Diagnosis Date Noted  ? No Active Ambulatory Problems  ? ?Resolved Ambulatory Problems  ?  Diagnosis Date Noted  ? No Resolved Ambulatory Problems  ? ?Past Medical History:  ?  Diagnosis Date  ? A-fib (HCC)   ? Alcohol abuse   ? Anemia   ? Anticoagulant long-term use   ? Aortic  valve regurgitation due to aortic dissection (HCC)   ? Back pain   ? Bereavement   ? CAD (coronary artery disease)   ? Cocaine dependence in remission [facility])   ? COPD (chronic obstructive pulmonary disease) (HCC)   ? Cough   ? Depression   ? Depressive disorder   ? Flat foot   ? History of aortic valve replacement 2003  ? History of chlamydia   ? HLD (hyperlipidemia)   ? Hypertension   ? Inferior ST segment depression   ? Inguinal hernia   ? Insomnia   ? Major depression   ? Old MI (myocardial infarction)   ? Postoperative seroma of skin after dermatologic procedure   ? PTSD (post-traumatic stress disorder)   ? PTSD (post-traumatic stress disorder)   ? Sarcoidosis   ? Trichomonas infection   ? ?Micro Data:  ?N/A ? ?Antimicrobials:  ?N/A ? ?Significant Hospital Events: ?Including procedures, antibiotic start and stop dates in addition to other pertinent events   ?4/19: Presents to ED with chest pain.  CT findings discussed with cardiothoracic surgery at [facility], patient deemed not a surgical candidate.  PCCM asked to admit at [facility] for strict blood pressure and heart rate management with esmolol drip, along with pain control ?4/20: Pt with nausea/dry heaving and shortness of breath.  Requiring 3L O2 via nasal canula due to pt becoming hypoxic on RA (O2 sats decrease to the 70's)  Requiring 250 mcg/kg/min to maintain sbp 100-120 ? ?Interim History / Subjective:  ?Pt requiring 3L O2 via nasal canula with shortness of breath at rest worse with exertion.  Overnight and this morning developed nausea with periods of dry heaving.  ? ?Objective   ?Blood pressure 130/89, pulse 87, temperature (!) 97.5 ?F (36.4 ?C), temperature source Oral, resp. rate (!) 23, height 5\' 7"  (1.702 m), weight 67.1 kg, SpO2 95 %. ?   ?   ? ?Intake/Output Summary (Last 24 hours) at 01/16/2022 0909 ?Last data filed at 01/16/2022 0700 ?Gross per 24 hour  ?Intake 596.48 ml  ?Output 350 ml  ?Net 246.48 ml  ? ?Filed Weights  ? 01/15/22 1201  01/15/22 1810 01/16/22 0427  ?Weight: 70.3 kg 64.3 kg 67.1 kg  ? ? ?Examination: ?General: Acutely ill-appearing male, in mild respiratory distress sitting up in bed  ?HENT: Atraumatic, normocephalic, neck supple, no JVD, moist mucous membranes ?Lungs: Faint expiratory wheezes throughout, with mild tachypnea, non labored Cardiovascular: NSR, rrr, no R/G, 2+ radial/1+ distal pulses, no edema  ?Abdomen: +BS x4, soft, non tender, non distended, right inguinal hernia reducible ?Extremities: Normal bulk and tone, no deformities, no cyanosis  ?Neuro: Alert and oriented x4, follows commands, PERRLA  ?GU: Deferred ? ?Resolved Hospital Problem list   ? ? ?Assessment & Plan:  ? 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 01/18/22 with Vascular Surgery here at [facility], she is in agreement that no intervention can be performed, and  is in agreement with BP & HR control with infusions as outlined above  ?-Case discussed with Dr. Evie Lacks regarding whether to reverse Coumadin due to intramural hematoma of distal thoracic aortic arch ~ Dr. Evie Lacks recommended letting INR come down on its own given aortic valve replacement ? ?Right inguinal hernia ?-Deemed NOT a surgical candidate currently due to seriousness of AAA and dissection ? ?Nausea ?-Prn phenergan and zofran for nausea and/or vomiting  ? ?Acute on chronic hypoxic respiratory failure secondary to COPD and sarcoidosis  ?-Supplemental O2 for dyspnea and/or hypoxia  ?-Prn bronchodilator therapy  ?-CPAP qhs and prn  ? ?Pt's abdominal aneurysm is  not amendable to surgical intervention per CT Surgery.  Despite medical management, could rupture at any time.  Recommend DNR/DNI status as if it were to rupture, any resuscitative efforts would be futile. ? ?Bes

## 2022-01-16 NOTE — Consult Note (Addendum)
? ?                                                                                ?Consultation Note ?Date: 01/16/2022  ? ?Patient Name: Juan Herrera  ?DOB: Jun 15, 1956  MRN: [id number]  Age / Sex: 66 y.o., male  ?[facility]P: [facility] ?Referring Physician: Erin Fulling, MD ? ?Reason for Consultation: Establishing goals of care ? ?HPI/Patient Profile: 66 y.o. 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  ? ?Clinical Assessment and Goals of Care: ?I have reviewed medical records including EPIC notes, labs and imaging, received report from RN, assessed the patient.  Juan Herrera is lying quietly in bed.  He greets me, making and somewhat keeping eye contact.  He does not appear to be acutely ill. He is oriented to self only, quite possibly due to pain medication given 1 hour prior.  He is clearly able to make his basic needs known.  There is no family at bedside at this time.   ? ?We meet to discuss diagnosis prognosis, GOC, EOL wishes, disposition and options.  I introduced Palliative Medicine as specialized medical care for people living with serious illness. It focuses on providing relief from the symptoms and stress of a serious illness. The goal is to improve quality of life for both the patient and the family. ? ?We discussed a brief life review of the patient.  Juan Herrera tells me that he is disabled.  He has 3 children, aged 2, 4 and a with his partner Ms. Senaida Ores.  Somewhat limited interview today due to mental status after receiving pain  medications. ? ?We then focused on their current illness.  We talk about Juan Herrera's acute health concerns including, but not limited to aortic aneurysm, blood pressure and heart rate control.  We talked about time for outcomes.  Palliative medicine team to follow-up tomorrow.   ? ?Advanced directives, concepts specific to code status, artifical feeding and hydration, and rehospitalization were considered and discussed by critical care team earlier today.  Juan Herrera has elected to remain full scope/full code. ? ?Discussed the importance of continued conversation with family and the medical providers regarding overall plan of care and treatment options, ensuring decisions are within the context of the patient?s values and GOCs.  Questions and concerns were addressed.  The family was encouraged to call with questions or concerns.  PMT will continue to support holistically. ? ?Conference with CCM attending, CCM NP, bedside nursing staff, related to patient condition, needs, goals of care. ? ?Addendum: Chat from bedside nursing staff stating that Juan Herrera has decided he wants to leave today.  He is agreeable to go with the benefits of hospice care.  Face-to-face meeting with Juan Herrera.  We talked about the tenets of hospice and provider  choice.  No special equipment needs at this time.  Conference with [facility], provider of choice. ? ?HCPOA ?OTHER -Juan Herrera has named his significant other, Calesha Richardson/mother of his 3 children, as his healthcare surrogate. ?  ? ?SUMMARY OF RECOMMENDATIONS   ?At this point continue full scope/full code ?Time for outcomes ?PMT to follow ? ? ?Code Status/Advance Care Planning: ?Full code -in-depth CODE STATUS discussions were held by critical care team earlier today. ? ?Symptom Management:  ?Per CCM, no additional needs at this time. ? ?Palliative Prophylaxis:  ?Frequent Pain Assessment and Oral Care ? ?Additional Recommendations (Limitations, Scope, Preferences): ?Full Scope  Treatment ? ?Psycho-social/Spiritual:  ?Desire for further Chaplaincy support:no ?Additional Recommendations: Caregiving  Support/Resources and ICU Family Guide ? ?Prognosis:  ?Unable to determine, guarded at this point.  Sudden death in the next week would not be surprising. ? ?Discharge Planning:  To be determined, based on outcomes.   ? ?  ? ?Primary Diagnoses: ?Present on Admission: ? Aortic dissection (HCC) ? ? ?I have reviewed the medical record, interviewed the patient and family, and examined the patient. The following aspects are pertinent. ? ?Past Medical History:  ?Diagnosis Date  ? A-fib (HCC)   ? Alcohol abuse   ? Anemia   ? Anticoagulant long-term use   ? Aortic valve regurgitation due to aortic dissection (HCC)   ? Back pain   ? Bereavement   ? CAD (coronary artery disease)   ? Cocaine dependence in remission [facility])   ? COPD (chronic obstructive pulmonary disease) (HCC)   ? Cough   ? Depression   ? Depressive disorder   ? Flat foot   ? History of aortic valve replacement 2003  ? [facility]  ? History of chlamydia   ? HLD (hyperlipidemia)   ? Hypertension   ? Inferior ST segment depression   ? Inguinal hernia   ? Insomnia   ? Major depression   ? Old MI (myocardial infarction)   ? Postoperative seroma of skin after dermatologic procedure   ? PTSD (post-traumatic stress disorder)   ? PTSD (post-traumatic stress disorder)   ? Sarcoidosis   ? Trichomonas infection   ? ?Social History  ? ?Socioeconomic History  ? Marital status: Significant Other  ?  Spouse name: Franklyn Lor  ? Number of children: 3  ? Years of education: Not on file  ? Highest education level: Not on file  ?Occupational History  ? Not on file  ?Tobacco Use  ? Smoking status: Never  ? Smokeless tobacco: Never  ?Vaping Use  ? Vaping Use: Never used  ?Substance and Sexual Activity  ? Alcohol use: Yes  ?  Comment: occassionaly  ? Drug use: Never  ? Sexual activity: Not Currently  ?Other Topics Concern  ? Not on file  ?Social History Narrative  ? Not  on file  ? ?Social Determinants of Health  ? ?Financial Resource Strain: Not on file  ?Food Insecurity: Not on file  ?Transportation Needs: Not on file  ?Physical Activity: Not on file  ?Stress: Not on file  ?Social Connections: Not on file  ? ?Family History  ?Problem Relation Age of Onset  ? Healthy Mother   ?     passed of old age  ? Healthy Father   ? ?Scheduled Meds: ? budesonide (PULMICORT) nebulizer solution  0.5 mg Nebulization BID  ? buPROPion  300 mg Oral Daily  ? Chlorhexidine Gluconate Cloth  6 each Topical Q0600  ? feeding supplement  237 mL  Oral TID BM  ? hydroxychloroquine  400 mg Oral Daily  ? ipratropium-albuterol  3 mL Nebulization Q6H  ? mirtazapine  15 mg Oral QHS  ?  morphine injection  4 mg Intravenous Once  ? [START ON 01/17/2022] multivitamin with minerals  1 tablet Oral Daily  ? sertraline  100 mg Oral Daily  ? sodium chloride flush  3 mL Intravenous Q12H  ? ?Continuous Infusions: ? sodium chloride    ? esmolol 300 mcg/kg/min (01/16/22 1224)  ? niCARDipine Stopped (01/15/22 1731)  ? promethazine (PHENERGAN) injection (IM or IVPB)    ? ?PRN Meds:.sodium chloride, fentaNYL (SUBLIMAZE) injection, ipratropium-albuterol, ondansetron (ZOFRAN) IV, oxyCODONE, promethazine (PHENERGAN) injection (IM or IVPB), sodium chloride flush ?Medications Prior to Admission:  ?Prior to Admission medications   ?Medication Sig Start Date End Date Taking? Authorizing Provider  ?albuterol (VENTOLIN HFA) 108 (90 Base) MCG/ACT inhaler Inhale 1-2 puffs into the lungs every 6 (six) hours as needed for wheezing or shortness of breath.   Yes [provider]  ?buPROPion (WELLBUTRIN XL) 300 MG 24 hr tablet Take 300 mg by mouth daily.   Yes [provider]  ?cetirizine (ZYRTEC) 10 MG tablet Take 10 mg by mouth daily.   Yes [provider]  ?chlorthalidone (HYGROTON) 25 MG tablet Take 25 mg by mouth daily.   Yes [provider]  ?fluticasone-salmeterol (ADVAIR) 250-50 MCG/ACT AEPB Inhale 1  puff into the lungs in the morning and at bedtime.   Yes [provider]  ?hydroxychloroquine (PLAQUENIL) 200 MG tablet Take 400 mg by mouth daily.   Yes [provider]  ?melatonin 3 MG TAB

## 2022-01-16 NOTE — Plan of Care (Signed)
?  Problem: Education: ?Goal: Knowledge of General Education information will improve ?Description: Including pain rating scale, medication(s)/side effects and non-pharmacologic comfort measures ?Outcome: Progressing ?  ?Problem: Clinical Measurements: ?Goal: Will remain free from infection ?Outcome: Progressing ?Goal: Cardiovascular complication will be avoided ?Outcome: Progressing ?  ?Problem: Elimination: ?Goal: Will not experience complications related to bowel motility ?Outcome: Progressing ?Goal: Will not experience complications related to urinary retention ?Outcome: Progressing ?  ?Problem: Pain Managment: ?Goal: General experience of comfort will improve ?Outcome: Progressing ?  ?Problem: Safety: ?Goal: Ability to remain free from injury will improve ?Outcome: Progressing ?  ?Problem: Clinical Measurements: ?Goal: Ability to maintain clinical measurements within normal limits will improve ?Outcome: Not Progressing ?Goal: Diagnostic test results will improve ?Outcome: Not Progressing ?  ?Problem: Activity: ?Goal: Risk for activity intolerance will decrease ?Outcome: Not Progressing ?  ?Problem: Coping: ?Goal: Level of anxiety will decrease ?Outcome: Not Progressing ?  ?

## 2022-01-16 NOTE — Progress Notes (Signed)
0800 patient alert able to make all needs known pt upset that everyone keeps telling him he is not a surgical candidate plan of care reviewed with patient assuring him that we are treating him patient asked how he can go home. Rn told patient that he needs his HR and BP lower to be safe to go home due to his aortic dissection. RN also discussed going home on comfort care vs coming back to the hospital when symptoms get worse. Patient keeps taking off 02 despite getting 02 extension ?0900 patient girl friend present and patient doesn't want her to know his condition he even stated when he was ready he would talk with her also. Patient girlfriend tearful wanting more information but understanding that we can not discuss patient conditions with her. ?1100 PRN pain mediations given  ?1200 patient having confusion not knowing where he is at but knowing self and time, Patient keeps asking where his wife is and where the kids are patient assured that his girl friend was here today and left to get the kids from school and plans to return ?1500 patient getting out of bed taking off clothing leads no 02 on patient not agreeable to have back on ?[place] Girl friend here hospice called patient wanting to leave on hospice care NOW ?

## 2022-01-18 ENCOUNTER — Other Ambulatory Visit: Payer: Self-pay | Admitting: Surgery

## 2022-01-18 MED ORDER — OXYCODONE-ACETAMINOPHEN 5-325 MG PO TABS: 1.0000 | ORAL_TABLET | Freq: Four times a day (QID) | ORAL | 0 refills | Status: DC | PRN

## 2022-01-18 MED ORDER — IPRATROPIUM-ALBUTEROL 0.5-2.5 (3) MG/3ML IN SOLN: 3.0000 mL | RESPIRATORY_TRACT | 10 refills | Status: AC | PRN

## 2022-01-18 MED ORDER — IPRATROPIUM-ALBUTEROL 0.5-2.5 (3) MG/3ML IN SOLN: 3.0000 mL | Freq: Four times a day (QID) | RESPIRATORY_TRACT | Status: DC

## 2022-01-18 MED ORDER — ONDANSETRON HCL 8 MG PO TABS: 8.0000 mg | ORAL_TABLET | Freq: Three times a day (TID) | ORAL | 0 refills | Status: AC | PRN

## 2022-02-05 ENCOUNTER — Inpatient Hospital Stay: Admit: 2022-02-05 | Payer: No Typology Code available for payment source

## 2022-02-06 ENCOUNTER — Encounter: Admission: RE | Payer: Self-pay | Source: Home / Self Care

## 2022-02-06 ENCOUNTER — Ambulatory Visit
Admission: RE | Admit: 2022-02-06 | Payer: No Typology Code available for payment source | Source: Home / Self Care | Admitting: Surgery

## 2022-02-06 SURGERY — HERNIORRHAPHY, INGUINAL, ROBOT-ASSISTED, LAPAROSCOPIC
Anesthesia: General | Site: Groin | Laterality: Right

## 2022-04-01 ENCOUNTER — Emergency Department: Payer: No Typology Code available for payment source

## 2022-04-01 ENCOUNTER — Inpatient Hospital Stay
Admission: EM | Admit: 2022-04-01 | Discharge: 2022-04-02 | DRG: 299 | Disposition: A | Discharge status: Hospice | Payer: No Typology Code available for payment source | Attending: Internal Medicine | Admitting: Internal Medicine

## 2022-04-01 ENCOUNTER — Other Ambulatory Visit: Payer: Self-pay

## 2022-04-01 DIAGNOSIS — M549 Dorsalgia, unspecified: Secondary | ICD-10-CM

## 2022-04-01 DIAGNOSIS — Z6823 Body mass index (BMI) 23.0-23.9, adult: Secondary | ICD-10-CM

## 2022-04-01 DIAGNOSIS — R64 Cachexia: Secondary | ICD-10-CM | POA: Diagnosis present

## 2022-04-01 DIAGNOSIS — I959 Hypotension, unspecified: Secondary | ICD-10-CM | POA: Diagnosis present

## 2022-04-01 DIAGNOSIS — Z515 Encounter for palliative care: Secondary | ICD-10-CM

## 2022-04-01 DIAGNOSIS — Z66 Do not resuscitate: Secondary | ICD-10-CM | POA: Diagnosis present

## 2022-04-01 DIAGNOSIS — J9621 Acute and chronic respiratory failure with hypoxia: Secondary | ICD-10-CM | POA: Diagnosis present

## 2022-04-01 DIAGNOSIS — N179 Acute kidney failure, unspecified: Secondary | ICD-10-CM | POA: Diagnosis present

## 2022-04-01 DIAGNOSIS — I7100 Dissection of unspecified site of aorta: Secondary | ICD-10-CM | POA: Diagnosis present

## 2022-04-01 DIAGNOSIS — F4310 Post-traumatic stress disorder, unspecified: Secondary | ICD-10-CM | POA: Diagnosis present

## 2022-04-01 DIAGNOSIS — F32A Depression, unspecified: Secondary | ICD-10-CM | POA: Diagnosis present

## 2022-04-01 DIAGNOSIS — D649 Anemia, unspecified: Secondary | ICD-10-CM

## 2022-04-01 DIAGNOSIS — R54 Age-related physical debility: Secondary | ICD-10-CM | POA: Diagnosis present

## 2022-04-01 DIAGNOSIS — I2510 Atherosclerotic heart disease of native coronary artery without angina pectoris: Secondary | ICD-10-CM | POA: Diagnosis present

## 2022-04-01 DIAGNOSIS — Z9981 Dependence on supplemental oxygen: Secondary | ICD-10-CM

## 2022-04-01 DIAGNOSIS — E785 Hyperlipidemia, unspecified: Secondary | ICD-10-CM | POA: Diagnosis present

## 2022-04-01 DIAGNOSIS — D869 Sarcoidosis, unspecified: Secondary | ICD-10-CM

## 2022-04-01 DIAGNOSIS — I4891 Unspecified atrial fibrillation: Secondary | ICD-10-CM

## 2022-04-01 DIAGNOSIS — Z79899 Other long term (current) drug therapy: Secondary | ICD-10-CM | POA: Diagnosis not present

## 2022-04-01 DIAGNOSIS — E872 Acidosis, unspecified: Secondary | ICD-10-CM | POA: Diagnosis present

## 2022-04-01 DIAGNOSIS — J449 Chronic obstructive pulmonary disease, unspecified: Secondary | ICD-10-CM

## 2022-04-01 DIAGNOSIS — R579 Shock, unspecified: Secondary | ICD-10-CM

## 2022-04-01 DIAGNOSIS — I7101 Dissection of ascending aorta: Principal | ICD-10-CM | POA: Diagnosis present

## 2022-04-01 DIAGNOSIS — Z952 Presence of prosthetic heart valve: Secondary | ICD-10-CM

## 2022-04-01 DIAGNOSIS — G4700 Insomnia, unspecified: Secondary | ICD-10-CM | POA: Diagnosis present

## 2022-04-01 DIAGNOSIS — I119 Hypertensive heart disease without heart failure: Secondary | ICD-10-CM | POA: Diagnosis present

## 2022-04-01 DIAGNOSIS — I252 Old myocardial infarction: Secondary | ICD-10-CM | POA: Diagnosis not present

## 2022-04-01 DIAGNOSIS — R627 Adult failure to thrive: Secondary | ICD-10-CM | POA: Diagnosis present

## 2022-04-01 DIAGNOSIS — F1421 Cocaine dependence, in remission: Secondary | ICD-10-CM | POA: Diagnosis present

## 2022-04-01 LAB — COMPREHENSIVE METABOLIC PANEL
ALT: 10 U/L (ref 0–44)
AST: 27 U/L (ref 15–41)
Albumin: 3.4 g/dL — ABNORMAL LOW (ref 3.5–5.0)
Alkaline Phosphatase: 74 U/L (ref 38–126)
Anion gap: 17 — ABNORMAL HIGH (ref 5–15)
BUN: 24 mg/dL — ABNORMAL HIGH (ref 8–23)
CO2: 26 mmol/L (ref 22–32)
Calcium: 9 mg/dL (ref 8.9–10.3)
Chloride: 93 mmol/L — ABNORMAL LOW (ref 98–111)
Creatinine, Ser: 1.48 mg/dL — ABNORMAL HIGH (ref 0.61–1.24)
GFR, Estimated: 53 mL/min — ABNORMAL LOW (ref >60)
Glucose, Bld: 140 mg/dL — ABNORMAL HIGH (ref 70–99)
Potassium: 3.8 mmol/L (ref 3.5–5.1)
Sodium: 136 mmol/L (ref 135–145)
Total Bilirubin: 0.9 mg/dL (ref 0.3–1.2)
Total Protein: 7.5 g/dL (ref 6.5–8.1)

## 2022-04-01 LAB — CBC WITH DIFFERENTIAL/PLATELET
Abs Immature Granulocytes: 0.14 10*3/uL — ABNORMAL HIGH (ref 0.00–0.07)
Basophils Absolute: 0 10*3/uL (ref 0.0–0.1)
Basophils Relative: 0 %
Eosinophils Absolute: 0 10*3/uL (ref 0.0–0.5)
Eosinophils Relative: 0 %
HCT: 29.5 % — ABNORMAL LOW (ref 39.0–52.0)
Hemoglobin: 8.8 g/dL — ABNORMAL LOW (ref 13.0–17.0)
Immature Granulocytes: 1 %
Lymphocytes Relative: 19 %
Lymphs Abs: 2.1 10*3/uL (ref 0.7–4.0)
MCH: 27.3 pg (ref 26.0–34.0)
MCHC: 29.8 g/dL — ABNORMAL LOW (ref 30.0–36.0)
MCV: 91.6 fL (ref 80.0–100.0)
Monocytes Absolute: 1.1 10*3/uL — ABNORMAL HIGH (ref 0.1–1.0)
Monocytes Relative: 10 %
Neutro Abs: 8.1 10*3/uL — ABNORMAL HIGH (ref 1.7–7.7)
Neutrophils Relative %: 70 %
Platelets: 372 10*3/uL (ref 150–400)
RBC: 3.22 MIL/uL — ABNORMAL LOW (ref 4.22–5.81)
RDW: 14.1 % (ref 11.5–15.5)
WBC: 11.4 10*3/uL — ABNORMAL HIGH (ref 4.0–10.5)
nRBC: 0 % (ref 0.0–0.2)

## 2022-04-01 LAB — TROPONIN I (HIGH SENSITIVITY): Troponin I (High Sensitivity): 39 ng/L — ABNORMAL HIGH (ref <18)

## 2022-04-01 LAB — LACTIC ACID, PLASMA: Lactic Acid, Venous: 4.9 mmol/L (ref 0.5–1.9)

## 2022-04-01 LAB — CBG MONITORING, ED: Glucose-Capillary: 141 mg/dL — ABNORMAL HIGH (ref 70–99)

## 2022-04-01 MED ORDER — ONDANSETRON 4 MG PO TBDP: 4.0000 mg | ORAL_TABLET | Freq: Four times a day (QID) | ORAL | Status: DC | PRN

## 2022-04-01 MED ORDER — MORPHINE SULFATE (CONCENTRATE) 10 MG/0.5ML PO SOLN: 5.0000 mg | ORAL | Status: DC | PRN

## 2022-04-01 MED ORDER — GLYCOPYRROLATE 1 MG PO TABS
1.0000 mg | ORAL_TABLET | ORAL | Status: DC | PRN
Start: 2022-04-01 — End: 2022-04-01

## 2022-04-01 MED ORDER — HALOPERIDOL LACTATE 2 MG/ML PO CONC: 0.5000 mg | ORAL | Status: DC | PRN

## 2022-04-01 MED ORDER — ONDANSETRON HCL 4 MG/2ML IJ SOLN: 4.0000 mg | Freq: Four times a day (QID) | INTRAMUSCULAR | Status: DC | PRN

## 2022-04-01 MED ORDER — GLYCOPYRROLATE 1 MG PO TABS: 1.0000 mg | ORAL_TABLET | ORAL | Status: DC | PRN

## 2022-04-01 MED ORDER — SODIUM CHLORIDE 0.9 % IV SOLN: INTRAVENOUS | Status: DC

## 2022-04-01 MED ORDER — GLYCOPYRROLATE 0.2 MG/ML IJ SOLN: 0.2000 mg | INTRAMUSCULAR | Status: DC | PRN

## 2022-04-01 MED ORDER — MELATONIN 5 MG PO TABS: 5.0000 mg | ORAL_TABLET | Freq: Every evening | ORAL | Status: DC | PRN

## 2022-04-01 MED ORDER — HALOPERIDOL LACTATE 5 MG/ML IJ SOLN: 0.5000 mg | INTRAMUSCULAR | Status: DC | PRN

## 2022-04-01 MED ORDER — HALOPERIDOL 0.5 MG PO TABS: 0.5000 mg | ORAL_TABLET | ORAL | Status: DC | PRN

## 2022-04-01 MED ORDER — POLYVINYL ALCOHOL 1.4 % OP SOLN: 1.0000 [drp] | Freq: Four times a day (QID) | OPHTHALMIC | Status: DC | PRN

## 2022-04-01 MED ORDER — IPRATROPIUM-ALBUTEROL 0.5-2.5 (3) MG/3ML IN SOLN
3.0000 mL | RESPIRATORY_TRACT | Status: DC | PRN
Start: 2022-04-01 — End: 2022-04-02

## 2022-04-01 MED ORDER — FENTANYL CITRATE PF 50 MCG/ML IJ SOSY: 25.0000 ug | PREFILLED_SYRINGE | INTRAMUSCULAR | Status: DC | PRN

## 2022-04-01 MED ORDER — FENTANYL CITRATE PF 50 MCG/ML IJ SOSY
50.0000 ug | PREFILLED_SYRINGE | Freq: Once | INTRAMUSCULAR | Status: AC
  Administered 2022-04-01: 50 ug via INTRAVENOUS
  Filled 2022-04-01: qty 1

## 2022-04-01 MED ORDER — MIRTAZAPINE 15 MG PO TABS
15.0000 mg | ORAL_TABLET | Freq: Every day | ORAL | Status: DC
  Administered 2022-04-01: 15 mg via ORAL
  Filled 2022-04-01: qty 1

## 2022-04-01 MED ORDER — ACETAMINOPHEN 650 MG RE SUPP: 650.0000 mg | Freq: Four times a day (QID) | RECTAL | Status: DC | PRN

## 2022-04-01 MED ORDER — ACETAMINOPHEN 325 MG PO TABS
650.0000 mg | ORAL_TABLET | Freq: Four times a day (QID) | ORAL | Status: DC | PRN
Start: 2022-04-01 — End: 2022-04-02
  Filled 2022-04-01: qty 2

## 2022-04-01 MED ORDER — BIOTENE DRY MOUTH MT LIQD: 15.0000 mL | OROMUCOSAL | Status: DC | PRN

## 2022-04-01 MED ORDER — ONDANSETRON 4 MG PO TBDP
4.0000 mg | ORAL_TABLET | Freq: Four times a day (QID) | ORAL | Status: DC | PRN
Start: 2022-04-01 — End: 2022-04-02

## 2022-04-01 MED ORDER — SODIUM CHLORIDE 0.9 % IV BOLUS
1000.0000 mL | Freq: Once | INTRAVENOUS | Status: AC
  Administered 2022-04-01: 1000 mL via INTRAVENOUS

## 2022-04-01 MED ORDER — HYDROMORPHONE HCL 1 MG/ML IJ SOLN
0.5000 mg | INTRAMUSCULAR | Status: DC | PRN
  Administered 2022-04-01: 0.5 mg via INTRAVENOUS
  Filled 2022-04-01: qty 0.5

## 2022-04-01 MED ORDER — ACETAMINOPHEN 325 MG PO TABS
650.0000 mg | ORAL_TABLET | Freq: Four times a day (QID) | ORAL | Status: DC | PRN
  Administered 2022-04-02: 650 mg via ORAL

## 2022-04-01 MED ORDER — SERTRALINE HCL 50 MG PO TABS: 100.0000 mg | ORAL_TABLET | Freq: Every day | ORAL | Status: DC

## 2022-04-01 NOTE — Assessment & Plan Note (Signed)
Uncertain acuity No monitoring due to comfort care status

## 2022-04-01 NOTE — Assessment & Plan Note (Signed)
Liberal meals due to comfort care status

## 2022-04-01 NOTE — H&P (Addendum)
History and Physical    Patient: Juan Herrera WCH:[id number] DOB: [birth year] DOA: 04/01/2022 DOS: the patient was seen and examined on 04/01/2022 PCP: [facility]  Patient coming from: Home  Chief Complaint:  Chief Complaint  Patient presents with   Altered Mental Status    HPI: Juan Herrera is a 66 y.o. 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  He also had intermittent confusion and was less responsive.  On arrival of EMS he was reportedly minimally responsive with BP 60s over 30s and was administered an IV fluid bolus. ED course and data review: On arrival temp 93, pulse 120, respirations 22 and BP 65/55 dropping to as low as 57/43, improving to 86/63 at the time of admission.  O2 sats initially 95% on 2 L going as low as 71% on 2 L at the time of admission. Labs: WBC 11,000 with lactic acid 4.9.  Troponin 39.  hemoglobin 8.8 down from 11 a couple months prior.  Creatinine 1.48, up from baseline of 1.17. EKG, personally viewed and interpreted: Sinus tachycardia at 121 with no acute ST-T wave changes Chest x-ray showing the following: IMPRESSION: 1. New small pleural effusions. 2. Abnormal mediastinal contours with lobulated rounded density projecting to the left, corresponding to known aortic dissection and aneurysm. Overall size may have increased, although this may simply be related to differences in AP technique. 3. Right apical pleuroparenchymal opacity  with thick-walled cavity, unchanged. Scarring in the right mid lung  Patient was treated with morphine and an IV fluid bolus.  At the time of admission he was pain free and was mentating and able to express his wishes.  Remains DNR.  Wife at bedside.     Past Medical History:  Diagnosis Date   A-fib (HCC)    Alcohol abuse    Anemia    Anticoagulant long-term use    Aortic valve regurgitation due to aortic dissection (HCC)    Back pain    Bereavement    CAD (coronary artery disease)    Cocaine dependence in remission (HCC)    COPD (chronic obstructive pulmonary disease) (HCC)    Cough    Depression    Depressive disorder    Flat foot    History of aortic valve replacement 2003   [facility]   History of chlamydia    HLD (hyperlipidemia)    Hypertension    Inferior ST segment depression    Inguinal hernia    Insomnia    Major depression    Old MI (myocardial infarction)    Postoperative seroma of skin after dermatologic procedure    PTSD (post-traumatic stress disorder)    PTSD (post-traumatic stress disorder)    Sarcoidosis    Trichomonas infection    Past Surgical History:  Procedure Laterality Date   AORTIC VALVE REPLACEMENT     HERNIA REPAIR     left side 2021   Social History:  reports that he has never smoked. He has never used smokeless tobacco.  He reports current alcohol use. He reports that he does not use drugs.  No Known Allergies  Family History  Problem Relation Age of Onset   Healthy Mother        passed of old age   Healthy Father     Prior to Admission medications   Medication Sig Start Date End Date Taking? Authorizing Provider  cetirizine (ZYRTEC) 10 MG tablet Take 10 mg by mouth daily.   Yes [provider]  chlorthalidone (HYGROTON) 25 MG tablet Take 25 mg by mouth daily.   Yes [provider]  ipratropium-albuterol (DUONEB) 0.5-2.5 (3) MG/3ML SOLN Take 3 mLs by nebulization every 4 (four) hours as needed. 01/18/22  Yes  Sakai, Isami, DO  LORazepam (ATIVAN) 0.5 MG tablet Take 0.5 mg by mouth every 4 (four) hours as needed. 02/21/22  Yes [provider]  metoprolol tartrate (LOPRESSOR) 50 MG tablet Take 50 mg by mouth 2 (two) times daily.   Yes [provider]  mirtazapine (REMERON) 15 MG tablet Take 15 mg by mouth at bedtime.   Yes [provider]  Morphine Sulfate (MORPHINE CONCENTRATE) 10 mg / 0.5 ml concentrated solution Take 0.25 mLs by mouth every 2 (two) hours as needed for severe pain. 01/26/22  Yes [provider]  oxyCODONE (OXY IR/ROXICODONE) 5 MG immediate release tablet Take 5 mg by mouth every 4 (four) hours as needed. 03/13/22  Yes [provider]  rosuvastatin (CRESTOR) 10 MG tablet Take 10 mg by mouth daily. noncompliant   Yes [provider]  sertraline (ZOLOFT) 100 MG tablet Take 100 mg by mouth daily.   Yes [provider]  STIOLTO RESPIMAT 2.5-2.5 MCG/ACT AERS Inhale 2 puffs into the lungs daily. 01/29/22  Yes [provider]  albuterol (VENTOLIN HFA) 108 (90 Base) MCG/ACT inhaler Inhale 1-2 puffs into the lungs every 6 (six) hours as needed for wheezing or shortness of breath.    [provider]  buPROPion (WELLBUTRIN XL) 300 MG 24 hr tablet Take 300 mg by mouth daily. Patient not taking: Reported on 04/01/2022    [provider]  fluticasone-salmeterol (ADVAIR) 250-50 MCG/ACT AEPB Inhale 1 puff into the lungs in the morning and at bedtime. Patient not taking: Reported on 04/01/2022    [provider]  hydroxychloroquine (PLAQUENIL) 200 MG tablet Take 400 mg by mouth daily. Patient not taking: Reported on 04/01/2022    [provider]  melatonin 3 MG TABS tablet Take 3 mg by mouth at bedtime as needed.    [provider]  ondansetron (ZOFRAN) 8 MG tablet Take 1 tablet (8 mg total) by mouth every 8 (eight) hours as needed for nausea or vomiting. 01/18/22   Lysle Pearl, Isami, DO   oxyCODONE-acetaminophen (PERCOCET/ROXICET) 5-325 MG tablet Take 1-2 tablets by mouth every 6 (six) hours as needed for moderate pain. Patient not taking: Reported on 04/01/2022 01/18/22   Benjamine Sprague, DO  trimethoprim-polymyxin b (POLYTRIM) ophthalmic solution Place 1 drop into both eyes every 4 (four) hours. Patient not taking: Reported on 04/01/2022 12/27/21   Hans Eden, NP    Physical Exam: Vitals:   04/01/22 1730 04/01/22 1800 04/01/22 1830 04/01/22 1900  BP: (!) 75/62 (!) 59/50 (!) 73/52 (!) 86/63  Pulse: (!) 110 (!) 113  (!) 117  Resp: (!) 21 13 19 20   Temp:      TempSrc:      SpO2: 100% (!) 69%  (!) 71%  Weight:      Height:  Physical Exam Vitals and nursing note reviewed.  Constitutional:      Appearance: He is cachectic. He is ill-appearing and toxic-appearing.     Interventions: Face mask in place.  Cardiovascular:     Rate and Rhythm: Tachycardia present.  Pulmonary:     Effort: Tachypnea present.     Comments: Conversing in a whisper and 1 and 2 with Skin:    Capillary Refill: Capillary refill takes more than 3 seconds.     Coloration: Skin is pale.  Neurological:     Mental Status: He is oriented to person, place, and time. He is lethargic.     Labs on Admission: I have personally reviewed following labs and imaging studies  CBC: Recent Labs  Lab 04/01/22 1656  WBC 11.4*  NEUTROABS 8.1*  HGB 8.8*  HCT 29.5*  MCV 91.6  PLT [id number]   Basic Metabolic Panel: Recent Labs  Lab 04/01/22 1656  NA 136  K 3.8  CL 93*  CO2 26  GLUCOSE 140*  BUN 24*  CREATININE 1.48*  CALCIUM 9.0   GFR: Estimated Creatinine Clearance: 47.1 mL/min (A) (by C-G formula based on SCr of 1.48 mg/dL (H)). Liver Function Tests: Recent Labs  Lab 04/01/22 1656  AST 27  ALT 10  ALKPHOS 74  BILITOT 0.9  PROT 7.5  ALBUMIN 3.4*   No results for input(s): "LIPASE", "AMYLASE" in the last 168 hours. No results for input(s): "AMMONIA" in the last 168  hours. Coagulation Profile: No results for input(s): "INR", "PROTIME" in the last 168 hours. Cardiac Enzymes: No results for input(s): "CKTOTAL", "CKMB", "CKMBINDEX", "TROPONINI" in the last 168 hours. BNP (last 3 results) No results for input(s): "PROBNP" in the last 8760 hours. HbA1C: No results for input(s): "HGBA1C" in the last 72 hours. CBG: Recent Labs  Lab 04/01/22 1656  GLUCAP 141*   Lipid Profile: No results for input(s): "CHOL", "HDL", "LDLCALC", "TRIG", "CHOLHDL", "LDLDIRECT" in the last 72 hours. Thyroid Function Tests: No results for input(s): "TSH", "T4TOTAL", "FREET4", "T3FREE", "THYROIDAB" in the last 72 hours. Anemia Panel: No results for input(s): "VITAMINB12", "FOLATE", "FERRITIN", "TIBC", "IRON", "RETICCTPCT" in the last 72 hours. Urine analysis:    Component Value Date/Time   COLORURINE YELLOW 12/28/2019 1559   APPEARANCEUR CLEAR 12/28/2019 1559   LABSPEC >1.030 (H) 12/28/2019 1559   PHURINE 5.5 12/28/2019 1559   GLUCOSEU NEGATIVE 12/28/2019 1559   HGBUR NEGATIVE 12/28/2019 1559   BILIRUBINUR NEGATIVE 12/28/2019 1559   KETONESUR NEGATIVE 12/28/2019 1559   PROTEINUR 30 (A) 12/28/2019 1559   NITRITE NEGATIVE 12/28/2019 1559   LEUKOCYTESUR SMALL (A) 12/28/2019 1559    Radiological Exams on Admission: DG Chest Portable 1 View  Result Date: 04/01/2022 CLINICAL DATA:  Shortness of breath. Hospice patient. 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 EXAM: PORTABLE CHEST 1 VIEW COMPARISON:  Chest radiograph and CTA 01/15/2022 FINDINGS: Prior median sternotomy. Prosthetic aortic valve. Lobulated rounded density in the left upper lung zone contiguous with the mediastinum. This corresponds to a dissection and aneurysm on prior CTA. Overall size may have increased, although this may simply be related to differences in AP technique. Heart size grossly normal. Suspected small pleural effusions,  new. Right apical pleuroparenchymal opacity with thick-walled cavity, stable in radiographic appearance. Additional scarring noted in the right mid lung. No pneumothorax. IMPRESSION: 1. New small pleural effusions. 2. Abnormal mediastinal contours with lobulated rounded density projecting to the left, corresponding to known aortic dissection and aneurysm. Overall  size may have increased, although this may simply be related to differences in AP technique. 3. Right apical pleuroparenchymal opacity with thick-walled cavity, unchanged. Scarring in the right mid lung. Electronically Signed   By: Narda Rutherford M.D.   On: 04/01/2022 17:56     Data Reviewed: Relevant notes from primary care and specialist visits, past discharge summaries as available in EHR, including Care Everywhere. Prior diagnostic testing as pertinent to current admission diagnoses Updated medications and problem lists for reconciliation ED course, including vitals, labs, imaging, treatment and response to treatment Triage notes, nursing and pharmacy notes and ED provider's notes Notable results as noted in HPI   Assessment and Plan: * Shock circulatory (HCC) Comfort care measures Suspect progressing aortic dissection Patient presents with lethargy, altering levels of consciousness on arrival of EMS, hypothermia, hypotension, tachycardia and tachypnea, hypoxia, AKI, lactic acidosis Suspect related to progression of contained aortic dissection, given presenting symptoms of severe chest and back pain Pain control We will continue comfort care measures As needed IV fluid boluses for BP and comfort as patient was able to mentate better following IV fluid Extensive discussion lasting at least 30 minutes with wife and another 15 minutes with patient regarding likely etiology, plan of care and outlook  Acute on chronic respiratory failure with hypoxia (HCC) COPD and sarcoidosis Circulatory shock Continue supplemental oxygen for  comfort  AKI (acute kidney injury) (HCC) Likely secondary to renal hypoperfusion from circulatory shock Gentle IV hydration  Anemia Uncertain acuity No monitoring due to comfort care status  Frailty Liberal meals due to comfort care status  A-fib (HCC) Continue comfort care measures  COPD (chronic obstructive pulmonary disease) (HCC) DuoNebs as needed Continue supplemental oxygen        DVT prophylaxis: SCD  Consults: none  Advance Care Planning:   Code Status: DNR   Family Communication: Wife at bedside  Disposition Plan: Back to previous home environment  Severity of Illness: The appropriate patient status for this patient is INPATIENT. Inpatient status is judged to be reasonable and necessary in order to provide the required intensity of service to ensure the patient's safety. The patient's presenting symptoms, physical exam findings, and initial radiographic and laboratory data in the context of their chronic comorbidities is felt to place them at high risk for further clinical deterioration. Furthermore, it is not anticipated that the patient will be medically stable for discharge from the hospital within 2 midnights of admission.   * I certify that at the point of admission it is my clinical judgment that the patient will require inpatient hospital care spanning beyond 2 midnights from the point of admission due to high intensity of service, high risk for further deterioration and high frequency of surveillance required.*  Author: Andris Baumann, MD 04/01/2022 7:58 PM  For on call review [email or web address].

## 2022-04-01 NOTE — Progress Notes (Signed)
   04/01/22 1800  Clinical Encounter Type  Visited With Patient;Family  Visit Type Initial;ED  Referral From Nurse  Consult/Referral To Chaplain  Spiritual Encounters  Spiritual Needs Prayer   Chaplain provided emotional support to spouse of patient.

## 2022-04-01 NOTE — Assessment & Plan Note (Addendum)
Comfort care measures Suspect progressing aortic dissection Patient presents with lethargy, altering levels of consciousness on arrival of EMS, hypothermia, hypotension, tachycardia and tachypnea, hypoxia, AKI, lactic acidosis Suspect related to progression of contained aortic dissection, given presenting symptoms of severe chest and back pain Pain control We will continue comfort care measures As needed IV fluid boluses for BP and comfort as patient was able to mentate better following IV fluid Extensive discussion lasting at least 30 minutes with wife and another 15 minutes with patient regarding likely etiology, plan of care and outlook

## 2022-04-01 NOTE — ED Notes (Signed)
Patient family in room at this time.

## 2022-04-01 NOTE — Assessment & Plan Note (Signed)
Likely secondary to renal hypoperfusion from circulatory shock Gentle IV hydration

## 2022-04-01 NOTE — Assessment & Plan Note (Signed)
Continue comfort care measures

## 2022-04-01 NOTE — Assessment & Plan Note (Signed)
DuoNebs as needed Continue supplemental oxygen

## 2022-04-01 NOTE — ED Notes (Signed)
Pt satting 79% on room air, replaced [place], pt states the [place] isn't comfortable.  Pt placed on NRB, pt satting 99% on NRB

## 2022-04-01 NOTE — Assessment & Plan Note (Addendum)
COPD and sarcoidosis Circulatory shock Continue supplemental oxygen for comfort

## 2022-04-01 NOTE — ED Notes (Signed)
Pt in bed, eyes open and talking, family at bedside, md at bedside.  Md aware or oral temp, states rectal temp is not needed at this time.

## 2022-04-01 NOTE — Progress Notes (Signed)
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     Please call with any questions/concerns.    Thank you for the opportunity to participate in this patient's care.  Odette Fraction, MSW [facility] Liaison  [phone]

## 2022-04-01 NOTE — ED Notes (Signed)
Patient family at bedside MD Para March at bedside

## 2022-04-01 NOTE — ED Provider Notes (Signed)
[facility] Provider Note    Event Date/Time   First MD Initiated Contact with Patient 04/01/22 1644     (approximate)   History   Altered Mental Status   HPI  Juan Herrera is a 66 y.o. male  here with AMS. Pt has a h/o severe aortic dissection/aneurysm, non-surgical, on Hospice. Per report, he has been increasingly confused today, EMS called out due to confusion/distress. Per report from wife/so, pt has been c/o increasingly severe chest and back pain x 2-3 days. Hospice called out to the house yesterday and increased his PO meds. He had c/o sore throat/weak voice today then became less responsive. He arrives minimally responsive. BP 60/30s with EMS, IVF given.       Physical Exam   Triage Vital Signs: ED Triage Vitals  Enc Vitals Group     BP 04/01/22 1647 (!) 65/55     Pulse Rate 04/01/22 1647 (!) 120     Resp 04/01/22 1647 (!) 22     Temp --      Temp src --      SpO2 04/01/22 1647 95 %     Weight 04/01/22 1648 147 lb (66.7 kg)     Height 04/01/22 1648 5\' 7"  (1.702 m)     Head Circumference --      Peak Flow --      Pain Score --      Pain Loc --      Pain Edu? --      Excl. in GC? --     Most recent vital signs: Vitals:   04/02/22 0109 04/02/22 1033  BP: 136/74 (!) 115/56  Pulse: 96 99  Resp: (!) 22 16  Temp: 97.9 F (36.6 C) 98.3 F (36.8 C)  SpO2: 99% 100%     General: Minimally responsive, ill-appearing, diaphoretic. CV:  Tachycardic, diaphoretic, poor distal perfusion. Resp:  Tachypnea, mild basilar rales. Abd:  No distention. No tenderness. Other:  Significant pallor noted.   ED Results / Procedures / Treatments   Labs (all labs ordered are listed, but only abnormal results are displayed) Labs Reviewed  CBC WITH DIFFERENTIAL/PLATELET - Abnormal; Notable for the following components:      Result Value   WBC 11.4 (*)    RBC 3.22 (*)    Hemoglobin 8.8 (*)    HCT 29.5 (*)    MCHC 29.8 (*)    Neutro Abs 8.1  (*)    Monocytes Absolute 1.1 (*)    Abs Immature Granulocytes 0.14 (*)    All other components within normal limits  COMPREHENSIVE METABOLIC PANEL - Abnormal; Notable for the following components:   Chloride 93 (*)    Glucose, Bld 140 (*)    BUN 24 (*)    Creatinine, Ser 1.48 (*)    Albumin 3.4 (*)    GFR, Estimated 53 (*)    Anion gap 17 (*)    All other components within normal limits  LACTIC ACID, PLASMA - Abnormal; Notable for the following components:   Lactic Acid, Venous 4.9 (*)    All other components within normal limits  CBG MONITORING, ED - Abnormal; Notable for the following components:   Glucose-Capillary 141 (*)    All other components within normal limits  TROPONIN I (HIGH SENSITIVITY) - Abnormal; Notable for the following components:   Troponin I (High Sensitivity) 39 (*)    All other components within normal limits  TROPONIN I (HIGH SENSITIVITY) - Abnormal; Notable  for the following components:   Troponin I (High Sensitivity) 301 (*)    All other components within normal limits  LACTIC ACID, PLASMA     EKG Sinus tachycardia, ventricular rate 121.  PR 137, QRS 95, QTc 469.  No acute ST elevations or depressions.  LVH, likely ST elevation due to LVH.  Possible demand related changes.   RADIOLOGY Significant prominence of the aortic arch, has previously seen   I also independently reviewed and agree with radiologist interpretations.   PROCEDURES:  Critical Care performed: No  .1-3 Lead EKG Interpretation  Performed by: Shaune Pollack, MD Authorized by: Shaune Pollack, MD     Interpretation: abnormal     ECG rate:  100-130   ECG rate assessment: tachycardic     Rhythm: sinus tachycardia     Ectopy: none     Conduction: normal   Comments:     Indication: Chest pain     MEDICATIONS ORDERED IN ED: Medications  acetaminophen (TYLENOL) tablet 650 mg (has no administration in time range)    Or  acetaminophen (TYLENOL) suppository 650 mg (has  no administration in time range)  haloperidol (HALDOL) tablet 0.5 mg (has no administration in time range)    Or  haloperidol (HALDOL) 2 MG/ML solution 0.5 mg (has no administration in time range)  ondansetron (ZOFRAN-ODT) disintegrating tablet 4 mg (has no administration in time range)    Or  ondansetron (ZOFRAN) injection 4 mg (has no administration in time range)  glycopyrrolate (ROBINUL) tablet 1 mg (has no administration in time range)    Or  glycopyrrolate (ROBINUL) injection 0.2 mg (has no administration in time range)    Or  glycopyrrolate (ROBINUL) injection 0.2 mg (has no administration in time range)  antiseptic oral rinse (BIOTENE) solution 15 mL (has no administration in time range)  polyvinyl alcohol (LIQUIFILM TEARS) 1.4 % ophthalmic solution 1 drop (has no administration in time range)  morphine CONCENTRATE 10 MG/0.5ML oral solution 5 mg (has no administration in time range)    Or  morphine CONCENTRATE 10 MG/0.5ML oral solution 5 mg (has no administration in time range)  fentaNYL (SUBLIMAZE) injection 25 mcg (has no administration in time range)  ipratropium-albuterol (DUONEB) 0.5-2.5 (3) MG/3ML nebulizer solution 3 mL (has no administration in time range)  melatonin tablet 5 mg (has no administration in time range)  sertraline (ZOLOFT) tablet 100 mg (has no administration in time range)  mirtazapine (REMERON) tablet 15 mg (15 mg Oral Given 04/01/22 2105)  acetaminophen (TYLENOL) tablet 650 mg (650 mg Oral Given 04/02/22 0855)    Or  acetaminophen (TYLENOL) suppository 650 mg ( Rectal See Alternative 04/02/22 0855)  haloperidol (HALDOL) tablet 0.5 mg (has no administration in time range)    Or  haloperidol (HALDOL) 2 MG/ML solution 0.5 mg (has no administration in time range)    Or  haloperidol lactate (HALDOL) injection 0.5 mg (has no administration in time range)  0.9 %  sodium chloride infusion ( Intravenous New Bag/Given 04/01/22 2105)  HYDROmorphone (DILAUDID) injection  0.5 mg (0.5 mg Intravenous Given 04/01/22 2105)  sodium chloride 0.9 % bolus 1,000 mL (0 mLs Intravenous Stopped 04/01/22 1922)  fentaNYL (SUBLIMAZE) injection 50 mcg (50 mcg Intravenous Given 04/01/22 1755)     IMPRESSION / MDM / ASSESSMENT AND PLAN / ED COURSE  I reviewed the triage vital signs and the nursing notes.  The patient is on the cardiac monitor to evaluate for evidence of arrhythmia and/or significant heart rate changes.   Ddx:  Differential includes the following, with pertinent life- or limb-threatening emergencies considered:  Acute worsening of dissection, PNA, PTX, PE, sepsis  Patient's presentation is most consistent with acute presentation with potential threat to life or bodily function.  MDM:  66 yo M with h/o aortic dissection, severe, non-surgical on Hospice, here with AMS and chest pain. Clinically, concern for worsening dissection given his CP, back pain, and now hypotension with AMS. Initially, pt arrived hypotensive, minimally responsive. BP 50s/30s and pt cool to touch, diaphoretic. Per EMS, family would be okay with fluids so IVF started, with slight improvement in mentation. Pt major complaint is dry mouth, but per report he's been having severe back and chest pain for days.      MEDICATIONS GIVEN IN ED: Medications  acetaminophen (TYLENOL) tablet 650 mg (has no administration in time range)    Or  acetaminophen (TYLENOL) suppository 650 mg (has no administration in time range)  haloperidol (HALDOL) tablet 0.5 mg (has no administration in time range)    Or  haloperidol (HALDOL) 2 MG/ML solution 0.5 mg (has no administration in time range)  ondansetron (ZOFRAN-ODT) disintegrating tablet 4 mg (has no administration in time range)    Or  ondansetron (ZOFRAN) injection 4 mg (has no administration in time range)  glycopyrrolate (ROBINUL) tablet 1 mg (has no administration in time range)    Or  glycopyrrolate (ROBINUL) injection  0.2 mg (has no administration in time range)    Or  glycopyrrolate (ROBINUL) injection 0.2 mg (has no administration in time range)  antiseptic oral rinse (BIOTENE) solution 15 mL (has no administration in time range)  polyvinyl alcohol (LIQUIFILM TEARS) 1.4 % ophthalmic solution 1 drop (has no administration in time range)  morphine CONCENTRATE 10 MG/0.5ML oral solution 5 mg (has no administration in time range)    Or  morphine CONCENTRATE 10 MG/0.5ML oral solution 5 mg (has no administration in time range)  fentaNYL (SUBLIMAZE) injection 25 mcg (has no administration in time range)  ipratropium-albuterol (DUONEB) 0.5-2.5 (3) MG/3ML nebulizer solution 3 mL (has no administration in time range)  melatonin tablet 5 mg (has no administration in time range)  sertraline (ZOLOFT) tablet 100 mg (has no administration in time range)  mirtazapine (REMERON) tablet 15 mg (15 mg Oral Given 04/01/22 2105)  acetaminophen (TYLENOL) tablet 650 mg (650 mg Oral Given 04/02/22 0855)    Or  acetaminophen (TYLENOL) suppository 650 mg ( Rectal See Alternative 04/02/22 0855)  haloperidol (HALDOL) tablet 0.5 mg (has no administration in time range)    Or  haloperidol (HALDOL) 2 MG/ML solution 0.5 mg (has no administration in time range)    Or  haloperidol lactate (HALDOL) injection 0.5 mg (has no administration in time range)  0.9 %  sodium chloride infusion ( Intravenous New Bag/Given 04/01/22 2105)  HYDROmorphone (DILAUDID) injection 0.5 mg (0.5 mg Intravenous Given 04/01/22 2105)  sodium chloride 0.9 % bolus 1,000 mL (0 mLs Intravenous Stopped 04/01/22 1922)  fentaNYL (SUBLIMAZE) injection 50 mcg (50 mcg Intravenous Given 04/01/22 1755)     Consults:  None   EMR reviewed  Reviewed admission from 12/2021 with severe aortic dissection, confirmed d/c on Hospice     FINAL CLINICAL IMPRESSION(S) / ED DIAGNOSES   Final diagnoses:  Dissection of ascending aorta (HCC)     Rx / DC Orders   ED Discharge Orders  None        Note:  This document was prepared using Dragon voice recognition software and may include unintentional dictation errors.   Shaune Pollack, MD 04/02/22 1036

## 2022-04-01 NOTE — ED Notes (Addendum)
This Clinical research associate tried to call patients family at this time per pt request; no answer

## 2022-04-01 NOTE — ED Notes (Signed)
Pt in bed with eyes closed, pt opens eyes to verbal stim, pt aware that he is in a hospital and today is the 4th of July, pt states that he can't talk because his throat is sore, pt cold to the touch, pale and clammy, pt has iv in R ac, blood drawn and sent, ns infusion started.

## 2022-04-01 NOTE — ED Triage Notes (Signed)
[facility] reports pt coming from home. Pt is on hospice unknown reason per EMS. Pt BP was 73/41, pulse ox in the 80's. Pt is a DNR. EMS gave 500L NS and pt becoming more alert but still not answering questions.

## 2022-04-02 ENCOUNTER — Encounter: Payer: Self-pay | Admitting: Internal Medicine

## 2022-04-02 DIAGNOSIS — R579 Shock, unspecified: Secondary | ICD-10-CM

## 2022-04-02 LAB — TROPONIN I (HIGH SENSITIVITY): Troponin I (High Sensitivity): 301 ng/L (ref <18)

## 2022-04-02 LAB — LACTIC ACID, PLASMA: Lactic Acid, Venous: 1.5 mmol/L (ref 0.5–1.9)

## 2022-04-02 NOTE — Discharge Summary (Signed)
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  On arrival to the emergency room he was hypothermic, blood pressure 65/55, 95% on 2 L oxygen.  Patient was admitted to the hospital for symptom control, ongoing medication management and to provide comfort care measures that were not able to be provided at home.  Chest x-ray showed further enlarging aortic arch.  Progressive aortic dissection Acute on chronic respiratory failure with hypoxemia Circulatory shock Acute kidney injury Frailty and debility, failure to thrive Unable to manage symptoms at home  Patient currently remains stable and able to transfer to hospice.  He is in need for more care and symptom control with constant supervision. Symptoms are adequately managed with morphine and Xanax. Patient wants to go home, however family unable to meet his care. Decision was made to transfer him to inpatient hospice where he will be appropriately provided end-of-life. Patient will be getting comfort care medications before transferring in an ambulance. Stable for transfer to inpatient  hospice.    Discharge Diagnoses:  Principal Problem:   Shock circulatory (HCC) Active Problems:   Comfort measures only status   Back pain   Acute on chronic respiratory failure with hypoxia (HCC)   Anemia   AKI (acute kidney injury) (HCC)   Aortic dissection (HCC)   Hypotension   COPD (chronic obstructive pulmonary disease) (HCC)   Sarcoidosis   A-fib (HCC)   Lactic acidosis   Frailty    Discharge Instructions  Discharge Instructions     Diet general   Complete by: As directed    Increase activity slowly   Complete by: As directed       Allergies as of 04/02/2022   No Known Allergies      Medication List     STOP taking these medications    buPROPion 300 MG 24 hr tablet Commonly known as: WELLBUTRIN XL   chlorthalidone 25 MG tablet Commonly known as: HYGROTON   fluticasone-salmeterol 250-50 MCG/ACT Aepb Commonly known as: ADVAIR   hydroxychloroquine 200 MG tablet Commonly known as: PLAQUENIL   oxyCODONE 5 MG immediate release tablet Commonly known as: Oxy IR/ROXICODONE   oxyCODONE-acetaminophen 5-325 MG tablet Commonly known as: PERCOCET/ROXICET   rosuvastatin 10 MG tablet Commonly known as: CRESTOR   Stiolto Respimat 2.5-2.5 MCG/ACT Aers Generic drug: Tiotropium Bromide-Olodaterol   trimethoprim-polymyxin b ophthalmic solution Commonly known as: Polytrim       TAKE these medications    albuterol 108 (90 Base) MCG/ACT inhaler Commonly known as: VENTOLIN HFA Inhale 1-2 puffs into the lungs every 6 (six) hours as needed for wheezing or shortness of breath.   cetirizine 10 MG tablet Commonly known as: ZYRTEC Take 10 mg by mouth daily.   ipratropium-albuterol 0.5-2.5 (3) MG/3ML Soln Commonly known as: DUONEB Take 3 mLs by nebulization every 4 (  four) hours as needed.   LORazepam 0.5 MG tablet Commonly known as: ATIVAN Take 0.5 mg by mouth every 4 (four) hours as needed.   melatonin 3 MG Tabs tablet Take 3 mg by mouth at bedtime as  needed.   metoprolol tartrate 50 MG tablet Commonly known as: LOPRESSOR Take 50 mg by mouth 2 (two) times daily.   mirtazapine 15 MG tablet Commonly known as: REMERON Take 15 mg by mouth at bedtime.   morphine CONCENTRATE 10 mg / 0.5 ml concentrated solution Take 0.25 mLs by mouth every 2 (two) hours as needed for severe pain.   ondansetron 8 MG tablet Commonly known as: ZOFRAN Take 1 tablet (8 mg total) by mouth every 8 (eight) hours as needed for nausea or vomiting.   sertraline 100 MG tablet Commonly known as: ZOLOFT Take 100 mg by mouth daily.        No Known Allergies  Consultations: Hospice   Procedures/Studies: DG Chest Portable 1 View  Result Date: 04/01/2022 CLINICAL DATA:  Shortness of breath. Hospice patient. 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 EXAM: PORTABLE CHEST 1 VIEW COMPARISON:  Chest radiograph and CTA 01/15/2022 FINDINGS: Prior median sternotomy. Prosthetic aortic valve. Lobulated rounded density in the left upper lung zone contiguous with the mediastinum. This corresponds to a dissection and aneurysm on prior CTA. Overall size may have increased, although this may simply be related to differences in AP technique. Heart size grossly normal. Suspected small pleural effusions, new. Right apical pleuroparenchymal opacity with thick-walled cavity, stable in radiographic appearance. Additional scarring noted in the right mid lung. No pneumothorax. IMPRESSION: 1. New small pleural effusions. 2. Abnormal mediastinal contours with lobulated rounded density projecting to the left, corresponding to known aortic dissection and aneurysm. Overall size may have increased, although this may simply be related to differences in AP technique. 3. Right apical pleuroparenchymal opacity with thick-walled cavity, unchanged. Scarring in the right mid lung. Electronically Signed   By: Narda Rutherford M.D.    On: 04/01/2022 17:56   (Echo, Carotid, EGD, Colonoscopy, ERCP)    Subjective: Patient seen and examined.  Looks fairly comfortable.  He cannot voice much concerns.  He has very low voice, he tells me he is okay and wants to go home.  Remains on nonrebreather.  Blood pressures are adequate.   Discharge Exam: Vitals:   04/02/22 0109 04/02/22 1033  BP: 136/74 (!) 115/56  Pulse: 96 99  Resp: (!) 22 16  Temp: 97.9 F (36.6 C) 98.3 F (36.8 C)  SpO2: 99% 100%   Vitals:   04/01/22 2359 04/02/22 0024 04/02/22 0109 04/02/22 1033  BP: (!) 124/97  136/74 (!) 115/56  Pulse:  (!) 116 96 99  Resp:  (!) 21 (!) 22 16  Temp:   97.9 F (36.6 C) 98.3 F (36.8 C)  TempSrc:      SpO2:  100% 99% 100%  Weight:      Height:        General: Pt is alert, awake, frail and debilitated.  Sick looking.  Cachectic. Cardiovascular: RRR, S1/S2 +, tachycardic. Respiratory: Tachypneic.  Poor bilateral air entry.  Currently on nonrebreather and saturating adequate. Abdominal: Soft, NT, ND, bowel sounds +     The results of significant diagnostics from this hospitalization (including imaging, microbiology, ancillary and laboratory) are listed below for reference.     Microbiology: No results found for this or any previous visit (from the past 240  hour(s)).   Labs: BNP (last 3 results) No results for input(s): "BNP" in the last 8760 hours. Basic Metabolic Panel: Recent Labs  Lab 04/01/22 1656  NA 136  K 3.8  CL 93*  CO2 26  GLUCOSE 140*  BUN 24*  CREATININE 1.48*  CALCIUM 9.0   Liver Function Tests: Recent Labs  Lab 04/01/22 1656  AST 27  ALT 10  ALKPHOS 74  BILITOT 0.9  PROT 7.5  ALBUMIN 3.4*   No results for input(s): "LIPASE", "AMYLASE" in the last 168 hours. No results for input(s): "AMMONIA" in the last 168 hours. CBC: Recent Labs  Lab 04/01/22 1656  WBC 11.4*  NEUTROABS 8.1*  HGB 8.8*  HCT 29.5*  MCV 91.6  PLT 372   Cardiac Enzymes: No results for input(s):  "CKTOTAL", "CKMB", "CKMBINDEX", "TROPONINI" in the last 168 hours. BNP: Invalid input(s): "POCBNP" CBG: Recent Labs  Lab 04/01/22 1656  GLUCAP 141*   D-Dimer No results for input(s): "DDIMER" in the last 72 hours. Hgb A1c No results for input(s): "HGBA1C" in the last 72 hours. Lipid Profile No results for input(s): "CHOL", "HDL", "LDLCALC", "TRIG", "CHOLHDL", "LDLDIRECT" in the last 72 hours. Thyroid function studies No results for input(s): "TSH", "T4TOTAL", "T3FREE", "THYROIDAB" in the last 72 hours.  Invalid input(s): "FREET3" Anemia work up No results for input(s): "VITAMINB12", "FOLATE", "FERRITIN", "TIBC", "IRON", "RETICCTPCT" in the last 72 hours. Urinalysis    Component Value Date/Time   COLORURINE YELLOW 12/28/2019 1559   APPEARANCEUR CLEAR 12/28/2019 1559   LABSPEC >1.030 (H) 12/28/2019 1559   PHURINE 5.5 12/28/2019 1559   GLUCOSEU NEGATIVE 12/28/2019 1559   HGBUR NEGATIVE 12/28/2019 1559   BILIRUBINUR NEGATIVE 12/28/2019 1559   KETONESUR NEGATIVE 12/28/2019 1559   PROTEINUR 30 (A) 12/28/2019 1559   NITRITE NEGATIVE 12/28/2019 1559   LEUKOCYTESUR SMALL (A) 12/28/2019 1559   Sepsis Labs Recent Labs  Lab 04/01/22 1656  WBC 11.4*   Microbiology No results found for this or any previous visit (from the past 240 hour(s)).   Time coordinating discharge: 35 minutes  SIGNED:   Dorcas Carrow, MD  [facility] 04/02/2022, 11:52 AM

## 2022-04-02 NOTE — Plan of Care (Signed)
Patient discharged per MD orders at this time.patient was discharged to the [facility] hospice facility [place] [facility] per order.report was called to staff nurse before transport.Pt was transported by 2 [facility] personnel on a stretcher.

## 2022-04-02 NOTE — TOC Transition Note (Signed)
Transition of Care [facility]) - CM/SW Discharge Note   Patient Details  Name: Juan Herrera MRN: [id number] Date of Birth: 09-21-1956  Transition of [facility]) CM/SW Contact:  Truddie Hidden, RN Phone Number: 04/02/2022, 12:28 PM   Clinical Narrative:    Patient previously active with hospice at his home. Patient was accepted at hospice home and will transfer today. TOC signing off.          Patient Goals and CMS Choice        Discharge Placement                       Discharge Plan and Services                                     Social Determinants of Health (SDOH) Interventions     Readmission Risk Interventions     No data to display

## 2022-04-02 NOTE — Plan of Care (Signed)
Patient is on comfort care.

## 2022-04-02 NOTE — Progress Notes (Signed)
Lab called to inform critical value of troponin which is at 301. Hospitalist made aware.

## 2022-04-02 NOTE — Progress Notes (Signed)
Civil engineer, contracting [facility]) Hospital Liaison Note  Juan Herrera is a current patient that wished to transfer to the Hospice Home for symptom management & EOL care.  Approval for Hospice Home is determined by [facility] MD. Once [facility] MD has determined Hospice Home eligibility, [facility] will update hospital staff and family. Approved for Hospice Home.  Consent forms have been completed.  EMS notified of patient D/C and transport arranged for 12:30 p.m. TOC/[place] and Attending Physician/Dr. Jerral Ralph also notified of transport arrangement.    Please send signed DNR form with patient and RN call report to [phone].    Odette Fraction, MSW [facility] Liaison

## 2022-04-29 DEATH — deceased

## 2022-10-26 IMAGING — CR DG CHEST 2V
1 series · 2 of 2 positions shown · non-contrast
Comparison: 07/25/2019 chest radiograph.

CLINICAL DATA: Chest pain

EXAM:
CHEST - 2 VIEW

[Series 1: w chest pa · 0.14mm/px · 2 of 2 slices shown]
[im 1/2]
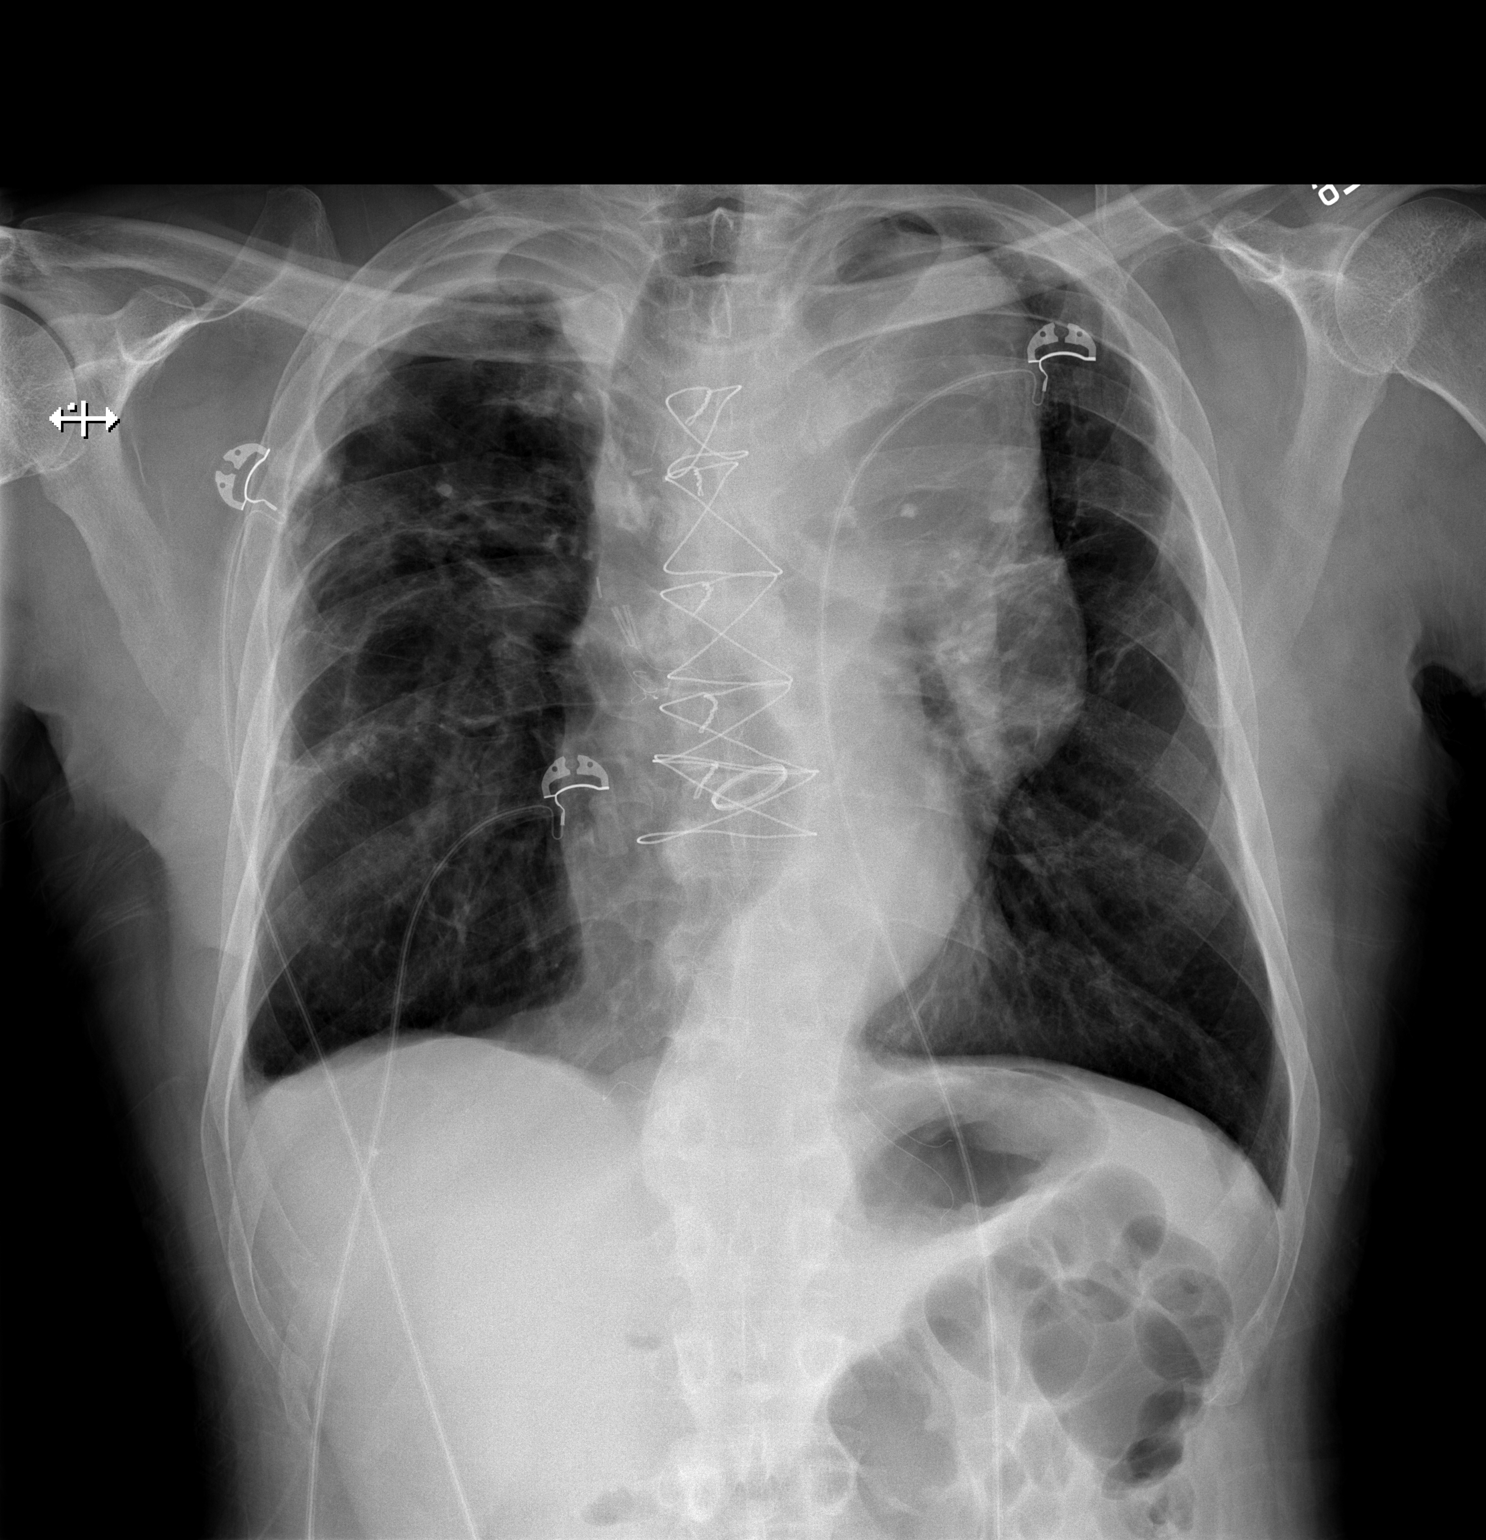
[im 2/2]
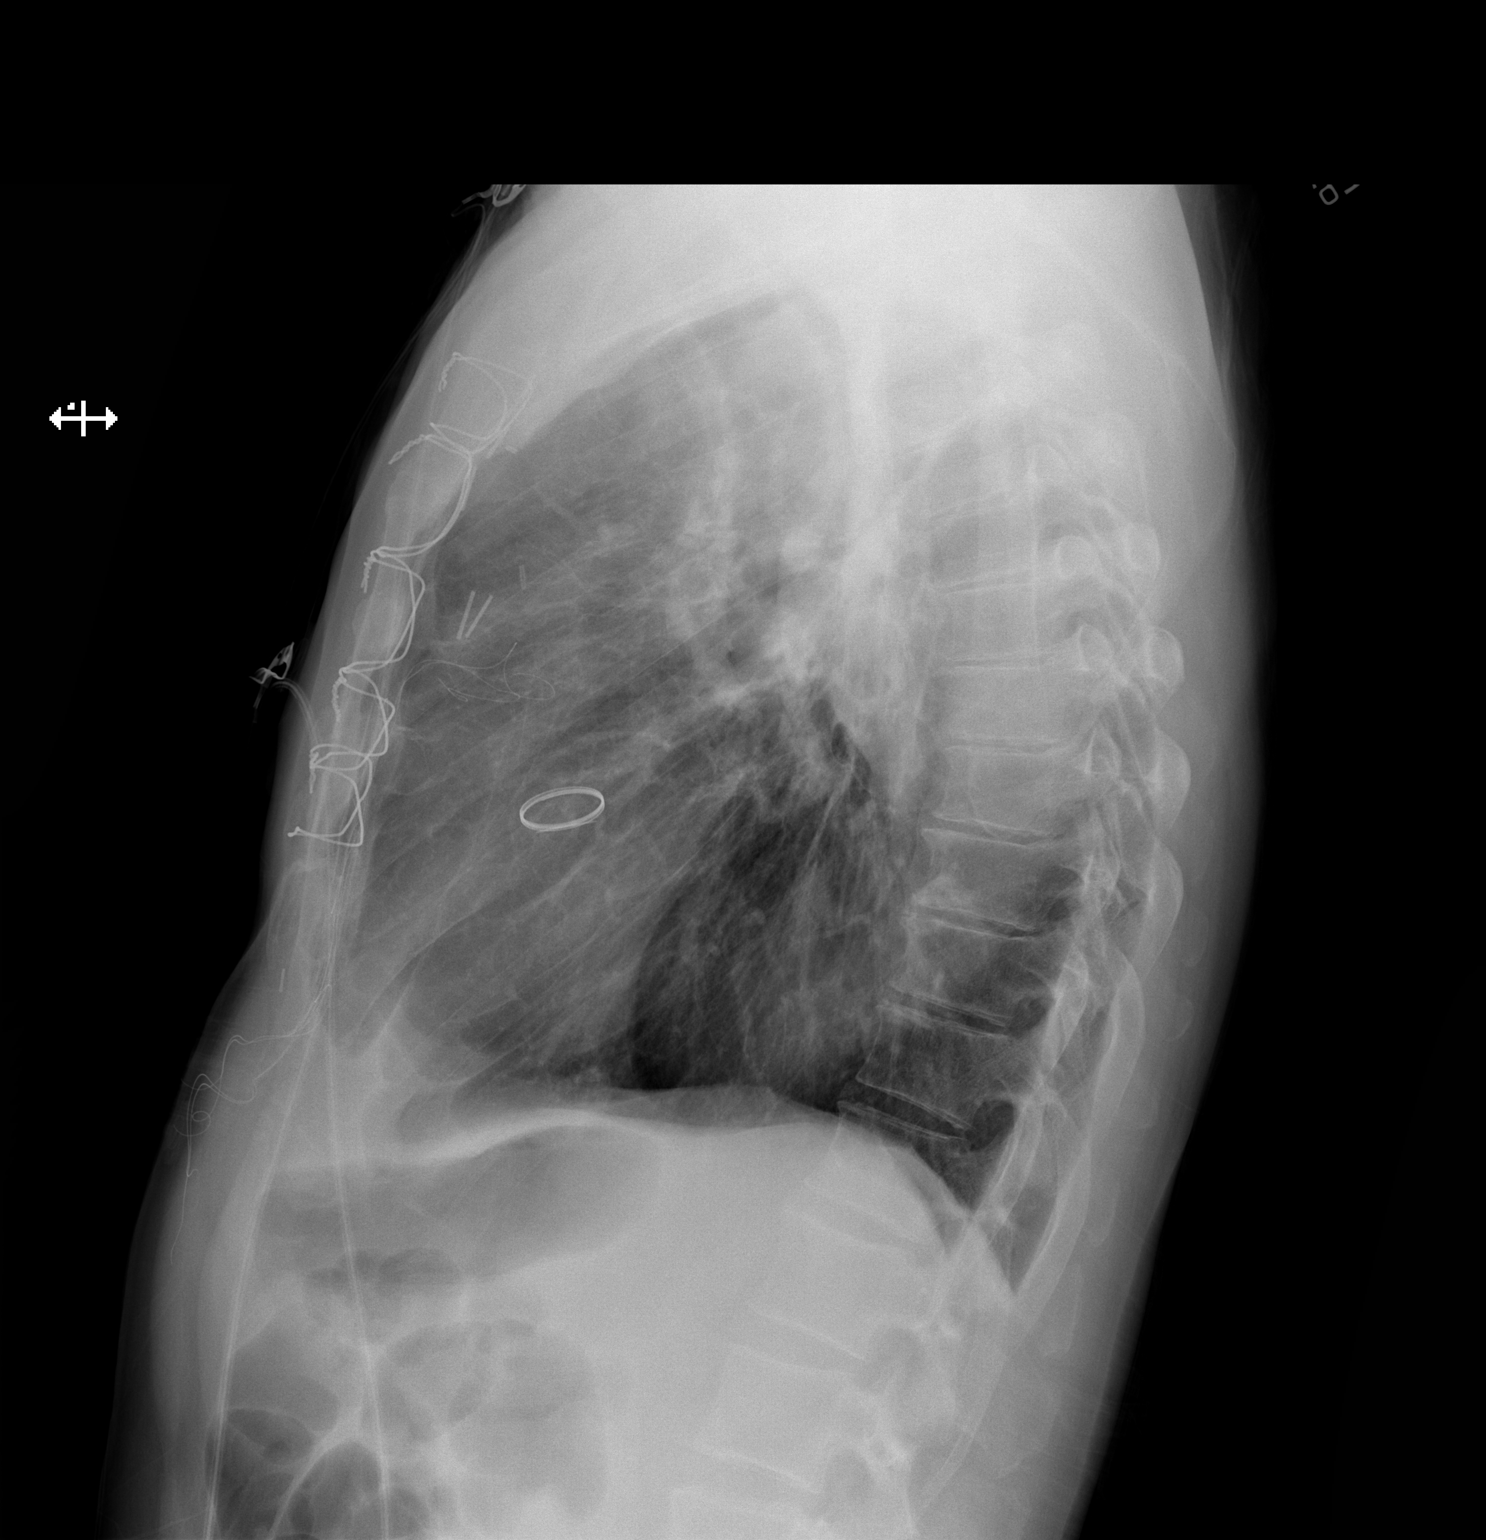

[2 of 2 positions shown; findings below may reference images not displayed]

FINDINGS: Intact sternotomy wires. Increased prominence of the aneurysmal
aortic arch and descending thoracic aortic contour. Normal heart
size. No pneumothorax. No pleural effusion. Chronic right apical
pleural capping and chronic streaky bilateral upper lung opacities
with some distortion. No acute consolidative airspace disease.
Aortic valve prosthesis in place.
IMPRESSION: Increased prominence of the aneurysmal aortic arch and descending
thoracic aortic contour, cannot exclude acute aortic syndrome. Chest
CT angiogram with IV contrast may be considered for further
evaluation as clinically warranted.

Chronic biapical pleural-parenchymal scarring. No acute pulmonary
disease.

## 2022-10-26 IMAGING — CT CT ANGIO CHEST-ABD-PELV FOR DISSECTION W/ AND WO/W CM
2 of 10 series · 11 of 46 positions shown, 15 images · IV contrast (APPLIED)
Comparison: None

CLINICAL DATA: Chest pain, back pain with suspected aortic
dissection in a patient with history of prior repair of the aortic
root and replacement of the aortic valve and previous aortic
dissection based on imaging reports dating back to 6961.

EXAM:
CT ANGIOGRAPHY CHEST, ABDOMEN AND PELVIS
TECHNIQUE: Non-contrast CT of the chest was initially obtained.

[Series 8: cor · coronal · 0.71mm/px · 2 of 149 slices shown]
[im 50/149  soft-tissue]
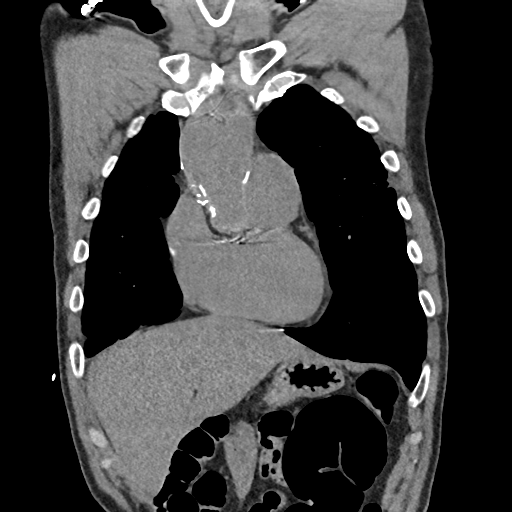
[im 99/149  soft-tissue]
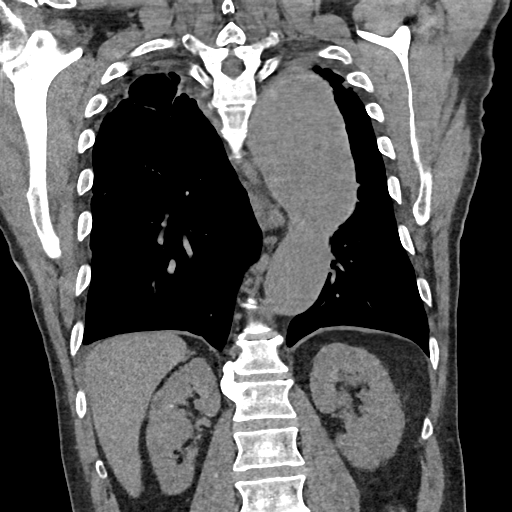

[Series 10: arterial · axial · arterial · 0.70mm/px · z∈[-877,-309]mm · 9 of 342 slices shown, 13 images]
[im 29/342  soft-tissue]
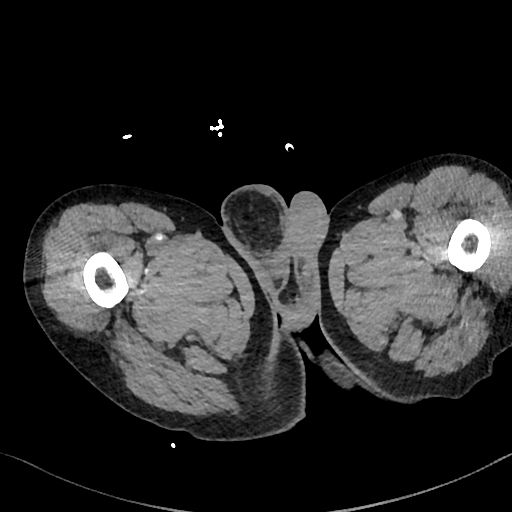
[im 29/342  bone]
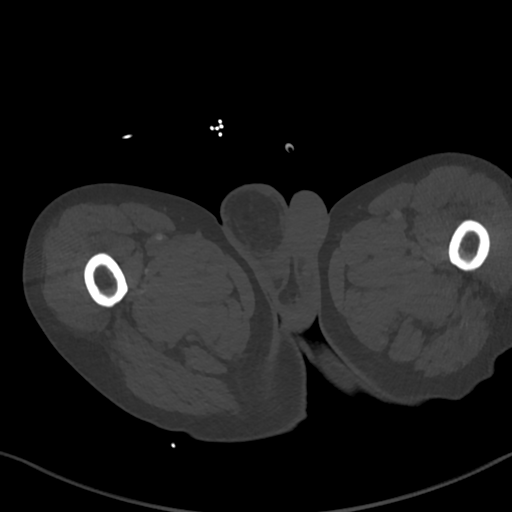
[im 86/342  soft-tissue]
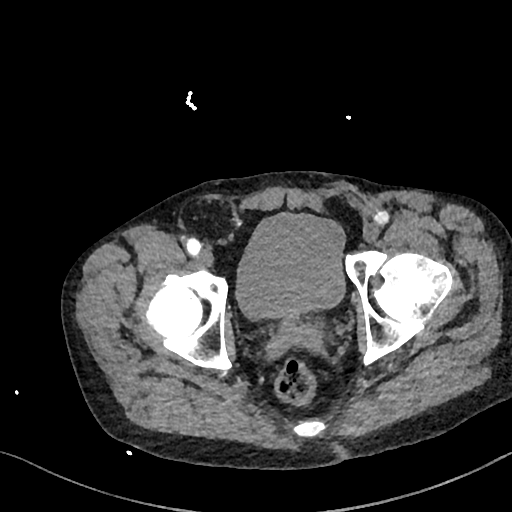
[im 114/342  soft-tissue]
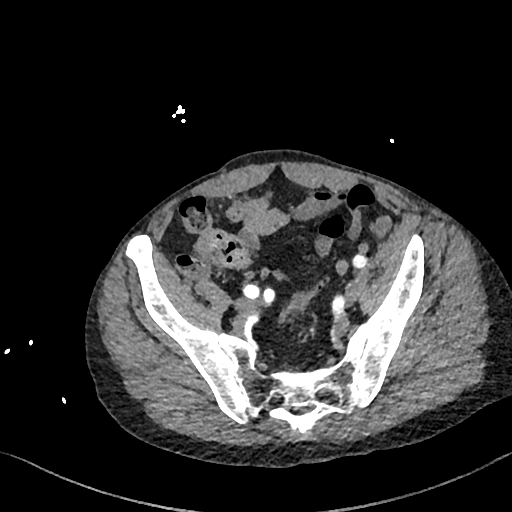
[im 143/342  soft-tissue]
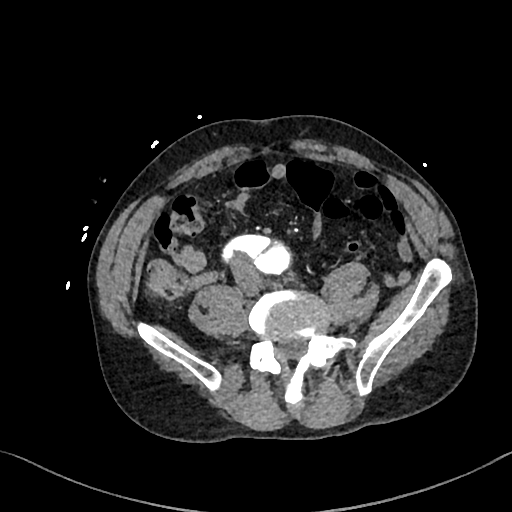
[im 199/342  soft-tissue]
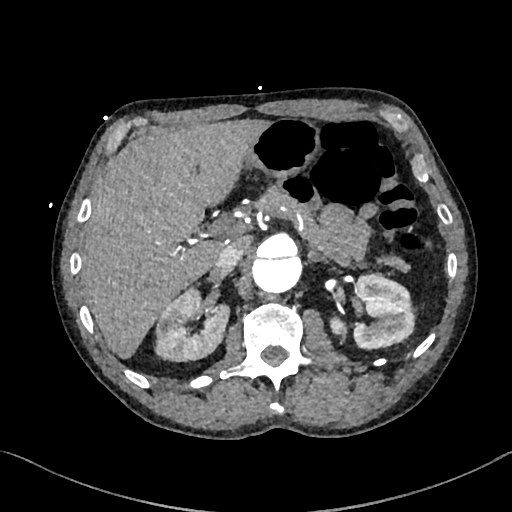
[im 228/342  soft-tissue]
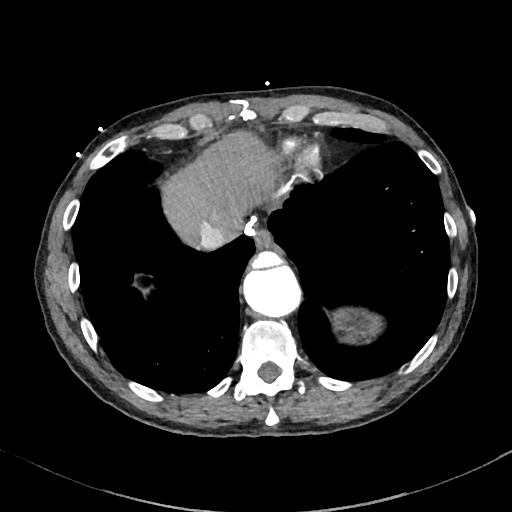
[im 228/342  lung]
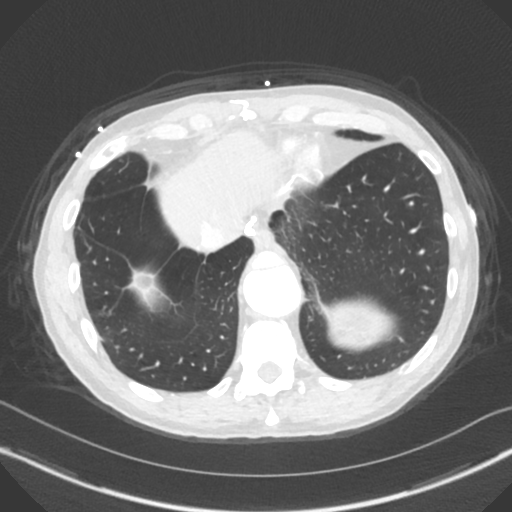
[im 256/342  soft-tissue]
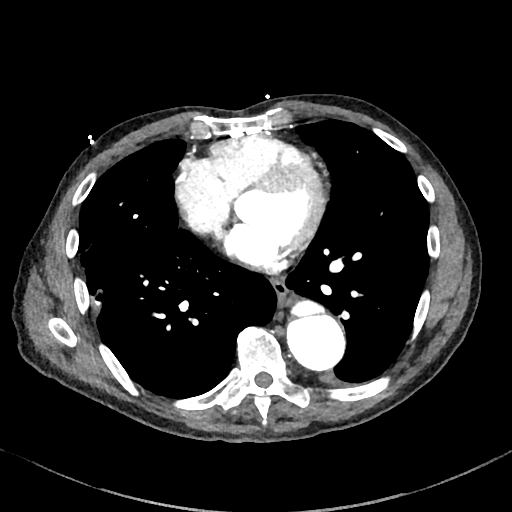
[im 256/342  lung]
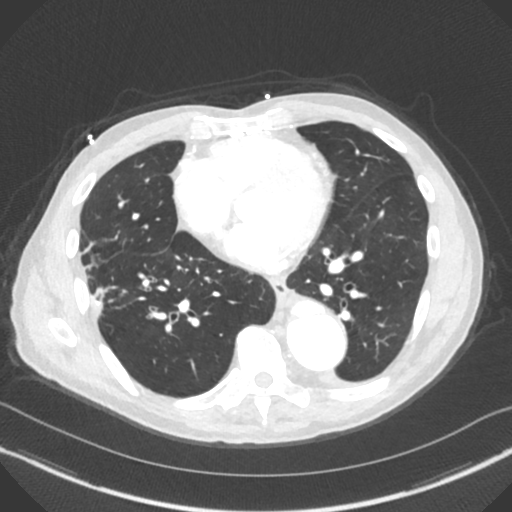
[im 285/342  lung]
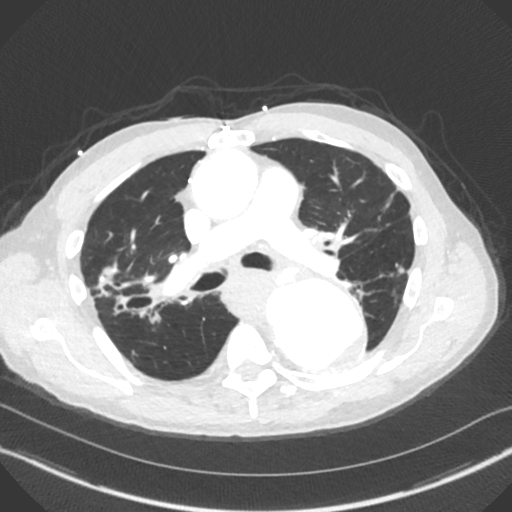
[im 313/342  soft-tissue]
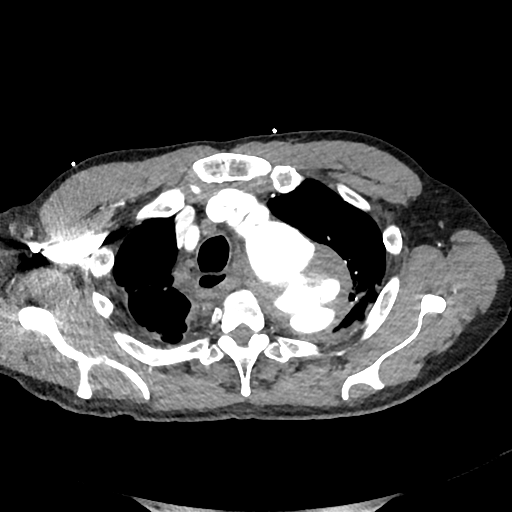
[im 313/342  lung]
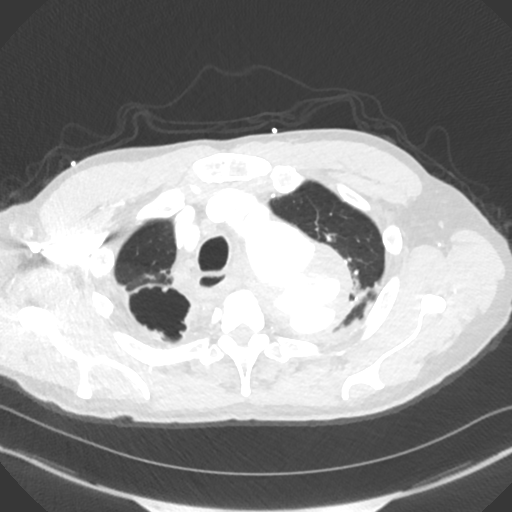

[11 of 46 positions shown; findings below may reference images not displayed]

Multidetector CT imaging through the chest, abdomen and pelvis was
performed using the standard protocol during bolus administration of
intravenous contrast. Multiplanar reconstructed images and MIPs were
obtained and reviewed to evaluate the vascular anatomy.

RADIATION DOSE REDUCTION: This exam was performed according to the
departmental dose-optimization program which includes automated
exposure control, adjustment of the mA and/or kV according to
patient size and/or use of iterative reconstruction technique.

CONTRAST:  100mL OMNIPAQUE IOHEXOL 350 MG/ML SOLN
FINDINGS: CTA CHEST FINDINGS

Cardiovascular: Signs of prior aortic root repair and aortic valve
replacement. Intramural hematoma seen on noncontrast imaging in the
distal thoracic aortic arch with focal outpouching extending above
the arch with the defect in the wall of the aortic arch measuring 18
mm. In the sagittal plane this breech measuring 24 mm. Hematoma and
contrast is contained to an area that extends into the LEFT upper
chest above the thoracic aortic arch approximately 3.5 cm beyond the
origin of the LEFT subclavian artery. This area measures
approximately 5.5 x 3.2 x 6.8 cm.

Signs of prior dissection exhibited on contrasted imaging with
fenestration extending into the abdominal aorta and LEFT iliac
artery.

Maximal caliber of the distal arch up to 6.6 cm (image 40/10)

4.7 cm caliber of the ascending thoracic aorta above the root repair
and valvular replacement is similar to reported caliber on previous
imaging as is maximal caliber of the distal arch/descending thoracic
aorta, just beyond the area of aortic rupture.

Great vessels in the chest despite extension of the fenestrated
dissection flap across the origin of the great vessels are well
opacified with contrast, configuration is similar to that described
on previous imaging evaluations.

Heart size is normal.

Central pulmonary vasculature is unremarkable though study is not
optimized for pulmonary vascular assessment.

Mediastinum/Nodes: Stranding about the LEFT mediastinal border in
the setting of contained rupture of the thoracic aorta no adenopathy
in the chest.

Lungs/Pleura: Signs of bronchiectasis and pleural and parenchymal
scarring particularly on the RIGHT in the upper lobe with an area of
cavitation in the RIGHT upper lobe measuring 4.6 x 3.5 cm. No
pneumothorax. No current evidence of pleural effusion. Grouped
nodules in the RIGHT lung base measuring 3-4 mm (image 92/11)
atelectasis at the lung bases is mild.

Musculoskeletal: See below for full musculoskeletal details. Post
sternotomy. No chest wall mass.

Review of the MIP images confirms the above findings.

CTA ABDOMEN AND PELVIS FINDINGS

VASCULAR

Aorta: Signs of dissection extending into the abdomen associated
with multiple fenestrations. Reportedly present on previous imaging
based on outside imaging report from [REDACTED] [HOSPITAL]
from 02/10/2020.

Celiac: LEFT gastric artery arises directly from the aorta with
narrowing at the origin. Other celiac branches are patent though
somewhat diminutive.

SMA: Widely patent without signs of dissection or vasculitis.

Renals: Widely patent. No signs of dissection involving the renal
arteries or aneurysm.

IMA: Patent and diminutive.

Inflow: Dual lumen with fenestrations showing variable opacification
of the anterior smaller lumen from which many of the visceral
branches arise. Sites of fenestration in the abdominal aorta allow
for opacification of this channel. LEFT renal artery arises from
posterior lumen. RIGHT renal artery, SMA and celiac arise from the
"anterior lumen.

Outflow: Grossly patent iliac vasculature though the contrast column
shows mixing and is not fully opacified distally slightly greater
opacification on the RIGHT than the LEFT.

Veins: No obvious venous abnormality within the limitations of this
arterial phase study.

Review of the MIP images confirms the above findings.

NON-VASCULAR

Hepatobiliary: No focal, suspicious hepatic lesion. No
pericholecystic stranding. No biliary duct distension.

Pancreas: Normal, without mass, inflammation or ductal dilatation.

Spleen: Normal.

Adrenals/Urinary Tract: Renal cortical scarring bilaterally. No
suspicious renal lesion or hydronephrosis.

Stomach/Bowel: No acute gastrointestinal findings on this immediate
arterial phase evaluation.

Lymphatic: No adenopathy in the abdomen or in the pelvis.

Reproductive: Unremarkable by CT.

Other: No ascites or free air in the abdomen. Large fat containing
RIGHT inguinal hernia continues into the scrotum. No current bowel
involvement. Signs of LEFT inguinal herniorrhaphy.

Musculoskeletal: Lytic focus in the RIGHT anterior femoral cortex
(image 278/10) 6 mm. Signs of median sternotomy. No displaced rib
fracture. Spinal alignment is normal. Spinal degenerative changes.

Review of the MIP images confirms the above findings.
IMPRESSION: 1. Contained rupture of the thoracic aortic arch beyond the origin
of the LEFT subclavian at the junction with descending thoracic
aorta as outlined above large area of added density in hematoma
based on noncontrast baseline that extends beyond the confines of
the thoracic aorta.
2. The aortic dissection extends into the abdominal aorta and LEFT
common iliac artery. Visceral branches arise from the more anterior
lumen with multiple fenestrations providing flow to both lumens
within the abdomen. The dissection is reported as a chronic finding
on previous imaging studies which are not available for review at
this time.
3. Lytic focus in the RIGHT anterior femoral cortex is is suspicious
but is well circumscribed. Correlate with any history of neoplasm
and consider follow-up with MRI when the patient is able.
4. Areas of bronchiectasis and RIGHT upper lobe cavitation in this
patient with history of sarcoidosis reportedly present on previous
imaging, comparison with this imaging on follow-up may be helpful to
determine whether there is progression.
5. Large fat containing RIGHT inguinal hernia extends into the
scrotum.

Critical Value/emergent results were called by telephone at the time
of interpretation on 01/15/2022 at [DATE] p.m. to provider Pretty Loveness
ASURUMA , who verbally acknowledged these results.
